# Patient Record
Sex: Male | Born: 1965 | Race: Black or African American | Hispanic: No | Marital: Married | Smoking: Current every day smoker
Health system: Southern US, Community
[De-identification: ages and names within clinical notes are randomized; demographics above are authoritative.]

## PROBLEM LIST (undated history)

## (undated) ENCOUNTER — Emergency Department (HOSPITAL_COMMUNITY): Discharge status: Absent without leave | Payer: BC Managed Care – PPO

## (undated) DIAGNOSIS — Z87442 Personal history of urinary calculi: Secondary | ICD-10-CM

## (undated) HISTORY — PX: HERNIA REPAIR: SHX51

---

## 2013-11-22 ENCOUNTER — Ambulatory Visit: Payer: Self-pay | Admitting: Internal Medicine

## 2013-12-04 ENCOUNTER — Ambulatory Visit: Payer: Self-pay | Admitting: Internal Medicine

## 2018-03-20 ENCOUNTER — Emergency Department (HOSPITAL_COMMUNITY)
Admission: EM | Admit: 2018-03-20 | Discharge: 2018-03-20 | Disposition: A | Discharge status: Home / Self Care | Payer: BLUE CROSS/BLUE SHIELD | Attending: Emergency Medicine | Admitting: Emergency Medicine

## 2018-03-20 ENCOUNTER — Encounter (HOSPITAL_COMMUNITY): Payer: Self-pay | Admitting: Emergency Medicine

## 2018-03-20 DIAGNOSIS — K649 Unspecified hemorrhoids: Secondary | ICD-10-CM | POA: Diagnosis present

## 2018-03-20 DIAGNOSIS — K623 Rectal prolapse: Secondary | ICD-10-CM | POA: Diagnosis not present

## 2018-03-20 DIAGNOSIS — F17210 Nicotine dependence, cigarettes, uncomplicated: Secondary | ICD-10-CM | POA: Diagnosis not present

## 2018-03-20 NOTE — ED Provider Notes (Signed)
[facility] EMERGENCY DEPARTMENT Provider Note   CSN: [id number] Arrival date & time: 03/20/18  0426     History   Chief Complaint Chief Complaint  Patient presents with  . Hemorrhoids    HPI Traylon Schimming is a 52 y.o. male.  The history is provided by the patient and the spouse. No language interpreter was used.     52 year old male with history of alcohol abuse presenting complaining of rectal pain.  History obtained through wife who is at bedside.  Patient has had a "knot" in his rectum which has been presents for "quite a while" however lately it has increased in size and became more painful.  Patient also admits to consume alcohol on a regular basis last consumption was last night.  Most of the history was obtained through wife who is at bedside.  Patient denies any rectal bleeding.  Denies any abdominal pain or back pain.  History reviewed. No pertinent past medical history.  There are no active problems to display for this patient.   Past Surgical History:  Procedure Laterality Date  . HERNIA REPAIR          Home Medications    Prior to Admission medications   Not on File    Family History No family history on file.  Social History Social History   Tobacco Use  . Smoking status: Current Every Day Smoker    Packs/day: 1.00  Substance Use Topics  . Alcohol use: Yes    Comment: daily 5-40 oz beers  . Drug use: Not on file     Allergies   Patient has no known allergies.   Review of Systems Review of Systems  Constitutional: Negative for fever.  Gastrointestinal: Positive for rectal pain. Negative for abdominal pain.     Physical Exam Updated Vital Signs BP (!) 166/97   Pulse 89   Temp 97.6 F (36.4 C) (Oral)   Resp 15   SpO2 94%   Physical Exam  Constitutional: He appears well-developed and well-nourished. No distress.  Patient resting comfortably in bed, appeared to be intoxicated  HENT:  Head: Atraumatic.  Eyes:  Conjunctivae are normal.  Neck: Neck supple.  Abdominal: Soft. He exhibits no distension. There is no tenderness.  Genitourinary:  Genitourinary Comments: Chaperone present during exam.  No external hemorrhoid noted.  Mild rectal prolapse easily reducible with direct pressure.  No obvious mass, no anal fissure, normal color stool on glove  Neurological: He is alert.  Skin: No rash noted.  Psychiatric: He has a normal mood and affect.  Nursing note and vitals reviewed.    ED Treatments / Results  Labs (all labs ordered are listed, but only abnormal results are displayed) Labs Reviewed - No data to display  EKG None  Radiology No results found.  Procedures Procedures (including critical care time)  Medications Ordered in ED Medications - No data to display   Initial Impression / Assessment and Plan / ED Course  I have reviewed the triage vital signs and the nursing notes.  Pertinent labs & imaging results that were available during my care of the patient were reviewed by me and considered in my medical decision making (see chart for details).     BP (!) 166/97   Pulse 89   Temp 97.6 F (36.4 C) (Oral)   Resp 15   SpO2 94%    Final Clinical Impressions(s) / ED Diagnoses   Final diagnoses:  Rectal prolapse    ED  Discharge Orders    None     6:49 AM Patient has evidence of a rectal prolapse easily reducible on exam.  No evidence of thrombosed hemorrhoid.  Encourage patient to follow-up with general surgery for further management as necessary.  Patient does have history of alcohol abuse and is intoxicated.  His wife is at bedside.  I offer alcohol detox and support but patient declined.  Will provide resources at discharge.  Return precautions discussed.   Fayrene Helper, PA-C 03/20/18 1610    Nira Conn, MD 03/21/18 0010

## 2018-03-20 NOTE — ED Triage Notes (Addendum)
Patient reports painful, bleeding knot to his rectum, states it has been there for a while but recently got painful. Pt intoxicated in triage, unable to provide much history, unsteady gait-pts significant other states patient drinks daily and has been drinking tonight.

## 2018-04-11 ENCOUNTER — Ambulatory Visit: Payer: Self-pay | Admitting: Surgery

## 2019-11-12 ENCOUNTER — Other Ambulatory Visit: Payer: Self-pay

## 2019-11-12 ENCOUNTER — Encounter (HOSPITAL_COMMUNITY): Payer: Self-pay | Admitting: Emergency Medicine

## 2019-11-12 ENCOUNTER — Emergency Department (HOSPITAL_COMMUNITY)
Admission: EM | Admit: 2019-11-12 | Discharge: 2019-11-12 | Disposition: A | Discharge status: Home / Self Care | Payer: BC Managed Care – PPO | Attending: Emergency Medicine | Admitting: Emergency Medicine

## 2019-11-12 DIAGNOSIS — F17210 Nicotine dependence, cigarettes, uncomplicated: Secondary | ICD-10-CM | POA: Diagnosis not present

## 2019-11-12 DIAGNOSIS — L309 Dermatitis, unspecified: Secondary | ICD-10-CM

## 2019-11-12 DIAGNOSIS — R21 Rash and other nonspecific skin eruption: Secondary | ICD-10-CM | POA: Diagnosis present

## 2019-11-12 MED ORDER — DIPHENHYDRAMINE HCL 25 MG PO TABS: 25.0000 mg | ORAL_TABLET | Freq: Four times a day (QID) | ORAL | 0 refills | Status: DC | PRN

## 2019-11-12 MED ORDER — HYDROCORTISONE 2.5 % EX LOTN: TOPICAL_LOTION | Freq: Two times a day (BID) | CUTANEOUS | 0 refills | Status: DC

## 2019-11-12 MED ORDER — PREDNISONE 10 MG PO TABS: 20.0000 mg | ORAL_TABLET | Freq: Every day | ORAL | 0 refills | Status: DC

## 2019-11-12 NOTE — ED Notes (Signed)
Pt verbalize d/c paperwork understanding.

## 2019-11-12 NOTE — ED Provider Notes (Signed)
[facility] EMERGENCY DEPARTMENT Provider Note   CSN: [id number] Arrival date & time: 11/12/19  3419     History Chief Complaint  Patient presents with  . Rash    Basil Buffin is a 54 y.o. male.  HPI 54 yo male presents complaining of rash for 6- 8 months.  Patient has noted red areas that became grayish.  Rash began on face and is now most prominent in bilateral axillas, behind his ears, and an area on the scalp.  The areas itch but are not painful.  No fever, cough, abdominal pain, or new product or allergen exposure known.  No intervention tried.  Symptoms have continued to spread. No known hepatitis.    There are no problems to display for this patient.   Past Surgical History:  Procedure Laterality Date  . HERNIA REPAIR         No family history on file.  Social History   Tobacco Use  . Smoking status: Current Every Day Smoker    Packs/day: 1.00  . Smokeless tobacco: Never Used  Substance Use Topics  . Alcohol use: Yes    Comment: daily 5-40 oz beers  . Drug use: Not Currently    Home Medications Prior to Admission medications   Not on File    Allergies    Patient has no known allergies.  Review of Systems   Review of Systems  All other systems reviewed and are negative.   Physical Exam Updated Vital Signs BP (!) 144/93 (BP Location: Left Arm)   Pulse 86   Temp 97.8 F (36.6 C) (Oral)   Resp 20   SpO2 96%   Physical Exam Vitals and nursing note reviewed.  Constitutional:      General: He is not in acute distress.    Appearance: Normal appearance. He is normal weight. He is not ill-appearing.  HENT:     Head: Normocephalic.     Ears:     Comments: External ear with plaques behind ears and some external ear involvement    Nose: Nose normal.     Mouth/Throat:     Mouth: Mucous membranes are moist.     Pharynx: Oropharynx is clear.  Eyes:     Extraocular Movements: Extraocular movements intact.     Pupils: Pupils are  equal, round, and reactive to light.  Cardiovascular:     Rate and Rhythm: Normal rate and regular rhythm.     Pulses: Normal pulses.  Pulmonary:     Effort: Pulmonary effort is normal.     Breath sounds: Normal breath sounds.  Abdominal:     General: Abdomen is flat.     Palpations: Abdomen is soft.  Musculoskeletal:        General: Normal range of motion.     Cervical back: Normal range of motion.  Skin:    General: Skin is warm and dry.     Capillary Refill: Capillary refill takes less than 2 seconds.     Findings: Rash present.     Comments: 3 x3 cm plaque right scalp grayish and patterned Axillary rash similar but more erythematous  Neurological:     General: No focal deficit present.     Mental Status: He is alert.  Psychiatric:        Mood and Affect: Mood normal.     ED Results / Procedures / Treatments   Labs (all labs ordered are listed, but only abnormal results are displayed) Labs Reviewed - No  data to display  EKG None  Radiology No results found.  Procedures Procedures (including critical care time)  Medications Ordered in ED Medications - No data to display  ED Course  I have reviewed the triage vital signs and the nursing notes.  Pertinent labs & imaging results that were available during my care of the patient were reviewed by me and considered in my medical decision making (see chart for details).    MDM Rules/Calculators/A&P                           Patient with dermatitis.  Unclear definitive diagnosis or etiology.  Patient may well need work up if current interventions do not improve.  Patient advised to obtain primary care and dermatology follow up. Plan oral steroid, benadryl, topical steroid.   Final Clinical Impression(s) / ED Diagnoses Final diagnoses:  Dermatitis    Rx / DC Orders ED Discharge Orders    None       Pattricia Boss, MD 11/12/19 [phone]

## 2019-11-12 NOTE — ED Triage Notes (Signed)
C/o itchy patches of skin on head and face x 2 months.

## 2019-11-12 NOTE — Discharge Instructions (Addendum)
Please take medication as prescribed. Make appointment with primary care doctor for complete physical and check up if symptoms do not resolve.

## 2020-02-20 ENCOUNTER — Emergency Department (HOSPITAL_COMMUNITY): Payer: BC Managed Care – PPO

## 2020-02-20 ENCOUNTER — Emergency Department (HOSPITAL_COMMUNITY)
Admission: EM | Admit: 2020-02-20 | Discharge: 2020-02-20 | Disposition: A | Discharge status: Home / Self Care | Payer: BC Managed Care – PPO | Attending: Emergency Medicine | Admitting: Emergency Medicine

## 2020-02-20 ENCOUNTER — Encounter (HOSPITAL_COMMUNITY): Payer: Self-pay | Admitting: Emergency Medicine

## 2020-02-20 DIAGNOSIS — N202 Calculus of kidney with calculus of ureter: Secondary | ICD-10-CM | POA: Insufficient documentation

## 2020-02-20 DIAGNOSIS — R030 Elevated blood-pressure reading, without diagnosis of hypertension: Secondary | ICD-10-CM

## 2020-02-20 DIAGNOSIS — N23 Unspecified renal colic: Secondary | ICD-10-CM

## 2020-02-20 DIAGNOSIS — F17200 Nicotine dependence, unspecified, uncomplicated: Secondary | ICD-10-CM | POA: Insufficient documentation

## 2020-02-20 DIAGNOSIS — R1031 Right lower quadrant pain: Secondary | ICD-10-CM | POA: Diagnosis present

## 2020-02-20 DIAGNOSIS — N200 Calculus of kidney: Secondary | ICD-10-CM

## 2020-02-20 LAB — CBC
HCT: 48.4 % (ref 39.0–52.0)
Hemoglobin: 16.6 g/dL (ref 13.0–17.0)
MCH: 31.1 pg (ref 26.0–34.0)
MCHC: 34.3 g/dL (ref 30.0–36.0)
MCV: 90.6 fL (ref 80.0–100.0)
Platelets: 300 10*3/uL (ref 150–400)
RBC: 5.34 MIL/uL (ref 4.22–5.81)
RDW: 12.1 % (ref 11.5–15.5)
WBC: 13.2 10*3/uL — ABNORMAL HIGH (ref 4.0–10.5)
nRBC: 0 % (ref 0.0–0.2)

## 2020-02-20 LAB — URINALYSIS, ROUTINE W REFLEX MICROSCOPIC
Bilirubin Urine: NEGATIVE
Glucose, UA: 50 mg/dL — AB
Ketones, ur: 80 mg/dL — AB
Leukocytes,Ua: NEGATIVE
Nitrite: NEGATIVE
Protein, ur: 100 mg/dL — AB
Specific Gravity, Urine: 1.017 (ref 1.005–1.030)
pH: 5 (ref 5.0–8.0)

## 2020-02-20 LAB — COMPREHENSIVE METABOLIC PANEL
ALT: 32 U/L (ref 0–44)
AST: 41 U/L (ref 15–41)
Albumin: 4.7 g/dL (ref 3.5–5.0)
Alkaline Phosphatase: 51 U/L (ref 38–126)
Anion gap: 16 — ABNORMAL HIGH (ref 5–15)
BUN: 16 mg/dL (ref 6–20)
CO2: 20 mmol/L — ABNORMAL LOW (ref 22–32)
Calcium: 9.7 mg/dL (ref 8.9–10.3)
Chloride: 102 mmol/L (ref 98–111)
Creatinine, Ser: 1.31 mg/dL — ABNORMAL HIGH (ref 0.61–1.24)
GFR calc Af Amer: >60 mL/min (ref >60)
GFR calc non Af Amer: >60 mL/min (ref >60)
Glucose, Bld: 137 mg/dL — ABNORMAL HIGH (ref 70–99)
Potassium: 4.3 mmol/L (ref 3.5–5.1)
Sodium: 138 mmol/L (ref 135–145)
Total Bilirubin: 1.1 mg/dL (ref 0.3–1.2)
Total Protein: 8.6 g/dL — ABNORMAL HIGH (ref 6.5–8.1)

## 2020-02-20 LAB — LIPASE, BLOOD: Lipase: 25 U/L (ref 11–51)

## 2020-02-20 MED ORDER — HYDROCODONE-ACETAMINOPHEN 5-325 MG PO TABS: 1.0000 | ORAL_TABLET | Freq: Four times a day (QID) | ORAL | 0 refills | Status: DC | PRN

## 2020-02-20 MED ORDER — LACTATED RINGERS IV BOLUS
1000.0000 mL | Freq: Once | INTRAVENOUS | Status: AC
  Administered 2020-02-20: 1000 mL via INTRAVENOUS

## 2020-02-20 MED ORDER — KETOROLAC TROMETHAMINE 30 MG/ML IJ SOLN
30.0000 mg | Freq: Once | INTRAMUSCULAR | Status: AC
  Administered 2020-02-20: 30 mg via INTRAVENOUS
  Filled 2020-02-20: qty 1

## 2020-02-20 MED ORDER — ONDANSETRON HCL 4 MG/2ML IJ SOLN
4.0000 mg | Freq: Once | INTRAMUSCULAR | Status: AC
  Administered 2020-02-20: 4 mg via INTRAVENOUS
  Filled 2020-02-20: qty 2

## 2020-02-20 MED ORDER — FENTANYL CITRATE (PF) 100 MCG/2ML IJ SOLN
100.0000 ug | Freq: Once | INTRAMUSCULAR | Status: AC
  Administered 2020-02-20: 100 ug via INTRAVENOUS
  Filled 2020-02-20: qty 2

## 2020-02-20 MED ORDER — ONDANSETRON 8 MG PO TBDP: 8.0000 mg | ORAL_TABLET | Freq: Three times a day (TID) | ORAL | 0 refills | Status: AC | PRN

## 2020-02-20 NOTE — ED Triage Notes (Signed)
Per EMS, patient from home, c/o RLQ pain since 0600 today. Describes pain as constant cramp. Reports vomiting and diarrhea.

## 2020-02-20 NOTE — ED Notes (Signed)
   02/20/20 1700  Vitals  BP (!) 209/104  MAP (mmHg) 133  BP Location Right Arm  BP Method Automatic  Patient Position (if appropriate) Lying  Pulse Rate 72  ECG Heart Rate 72  Resp (!) 30  MEWS COLOR  MEWS Score Color Red  Oxygen Therapy  SpO2 94 %  O2 Device Room Air  MEWS Score  MEWS Temp 0  MEWS Systolic 2  MEWS Pulse 0  MEWS RR 2  MEWS LOC 0  MEWS Score 4  W[place], EDP made aware of patient current BP. No new orders.

## 2020-02-20 NOTE — ED Provider Notes (Signed)
[facility]-EMERGENCY DEPT Provider Note   CSN: [id number] Arrival date & time: 02/20/20  1534     History Chief Complaint  Patient presents with  . Abdominal Pain    Roberto Rogers is a 54 y.o. male.  The history is provided by the patient and the spouse.  Abdominal Pain Pain location:  RLQ Pain quality: cramping   Pain radiates to:  Does not radiate Pain severity:  Severe Onset quality:  Gradual Duration:  10 hours Timing:  Constant Progression:  Worsening Chronicity:  New Relieved by:  Not moving Worsened by:  Movement and palpation Associated symptoms: vomiting   Associated symptoms: no chest pain, no constipation, no diarrhea, no dysuria, no fever, no hematemesis, no hematochezia, no nausea and no shortness of breath   patient reports waking up with RLQ pain He reports it is worsening No fever but he does have vomiting No change in bowel movements   PMH-none Past Surgical History:  Procedure Laterality Date  . HERNIA REPAIR         No family history on file.  Social History   Tobacco Use  . Smoking status: Current Every Day Smoker    Packs/day: 1.00  . Smokeless tobacco: Never Used  Substance Use Topics  . Alcohol use: Yes    Comment: daily 5-40 oz beers  . Drug use: Not Currently    Home Medications Prior to Admission medications   Medication Sig Start Date End Date Taking? Authorizing Provider  diphenhydrAMINE (BENADRYL) 25 MG tablet Take 1 tablet (25 mg total) by mouth every 6 (six) hours as needed. 11/12/19   Margarita Grizzle, MD  hydrocortisone 2.5 % lotion Apply topically 2 (two) times daily. 11/12/19   Margarita Grizzle, MD  predniSONE (DELTASONE) 10 MG tablet Take 2 tablets (20 mg total) by mouth daily. 11/12/19   Margarita Grizzle, MD    Allergies    Patient has no known allergies.  Review of Systems   Review of Systems  Constitutional: Negative for fever.  Respiratory: Negative for shortness of breath.   Cardiovascular:  Negative for chest pain.  Gastrointestinal: Positive for abdominal pain and vomiting. Negative for blood in stool, constipation, diarrhea, hematemesis, hematochezia and nausea.  Genitourinary: Negative for dysuria, scrotal swelling and testicular pain.  All other systems reviewed and are negative.   Physical Exam Updated Vital Signs BP (!) 211/112   Pulse 67   Temp 97.8 F (36.6 C) (Oral)   Resp (!) 29   Ht 1.676 m (5\' 6" )   Wt 72.6 kg   SpO2 98%   BMI 25.82 kg/m   Physical Exam CONSTITUTIONAL: Well developed/well nourished HEAD: Normocephalic/atraumatic EYES: EOMI/PERRL ENMT: Mucous membranes moist NECK: supple no meningeal signs SPINE/BACK:entire spine nontender CV: S1/S2 noted, no murmurs/rubs/gallops noted LUNGS: Lungs are clear to auscultation bilaterally, no apparent distress ABDOMEN: soft, moderate RLQ tenderness, no rebound or guarding, bowel sounds noted throughout abdomen GU:no cva tenderness NEURO: Pt is awake/alert/appropriate, moves all extremitiesx4.  No facial droop.   EXTREMITIES: pulses normal/equal, full ROM SKIN: warm, color normal PSYCH: no abnormalities of mood noted, alert and oriented to situation  ED Results / Procedures / Treatments   Labs (all labs ordered are listed, but only abnormal results are displayed) Labs Reviewed  COMPREHENSIVE METABOLIC PANEL - Abnormal; Notable for the following components:      Result Value   CO2 20 (*)    Glucose, Bld 137 (*)    Creatinine, Ser 1.31 (*)    Total  Protein 8.6 (*)    Anion gap 16 (*)    All other components within normal limits  CBC - Abnormal; Notable for the following components:   WBC 13.2 (*)    All other components within normal limits  URINALYSIS, ROUTINE W REFLEX MICROSCOPIC - Abnormal; Notable for the following components:   Color, Urine STRAW (*)    Glucose, UA 50 (*)    Hgb urine dipstick LARGE (*)    Ketones, ur 80 (*)    Protein, ur 100 (*)    Bacteria, UA RARE (*)    All other  components within normal limits  LIPASE, BLOOD    EKG None  Radiology CT Renal Stone Study  Result Date: 02/20/2020 CLINICAL DATA:  Right lower quadrant pain EXAM: CT ABDOMEN AND PELVIS WITHOUT CONTRAST TECHNIQUE: Multidetector CT imaging of the abdomen and pelvis was performed following the standard protocol without IV contrast. COMPARISON:  None. FINDINGS: Lower chest: The lung bases are clear. The heart size is normal. Hepatobiliary: There is decreased hepatic attenuation suggestive of hepatic steatosis. Normal gallbladder.There is no biliary ductal dilation. Pancreas: Normal contours without ductal dilatation. No peripancreatic fluid collection. Spleen: Unremarkable. Adrenals/Urinary Tract: --Adrenal glands: Unremarkable. --Right kidney/ureter: There is mild-to-moderate right-sided hydronephrosis secondary to an obstructing 4 mm stone in the mid to proximal right ureter (axial series 2, image 43). There is a nonobstructing stone in the lower pole the right kidney measuring approximately 5 mm. --Left kidney/ureter: No hydronephrosis or radiopaque kidney stones. --Urinary bladder: Unremarkable. Stomach/Bowel: --Stomach/Duodenum: No hiatal hernia or other gastric abnormality. Normal duodenal course and caliber. --Small bowel: Unremarkable. --Colon: There are scattered colonic diverticula without CT evidence for diverticulitis. --Appendix: Normal. Vascular/Lymphatic: Normal course and caliber of the major abdominal vessels. --No retroperitoneal lymphadenopathy. --No mesenteric lymphadenopathy. --No pelvic or inguinal lymphadenopathy. Reproductive: Unremarkable Other: No ascites or free air. The abdominal wall is normal. Musculoskeletal. No acute displaced fractures. IMPRESSION: 1. Right-sided obstructive uropathy secondary to an obstructing 4 mm stone in the mid to proximal right ureter. 2. Nonobstructive right nephrolithiasis. 3. Hepatic steatosis. 4. Scattered colonic diverticula without CT evidence for  diverticulitis. Electronically Signed   By: Katherine Mantle M.D.   On: 02/20/2020 17:28    Procedures Procedures    Medications Ordered in ED Medications  lactated ringers bolus 1,000 mL (has no administration in time range)  fentaNYL (SUBLIMAZE) injection 100 mcg (100 mcg Intravenous Given 02/20/20 1656)  ondansetron (ZOFRAN) injection 4 mg (4 mg Intravenous Given 02/20/20 1656)  ketorolac (TORADOL) 30 MG/ML injection 30 mg (30 mg Intravenous Given 02/20/20 1742)    ED Course  I have reviewed the triage vital signs and the nursing notes.  Pertinent labs & imaging results that were available during my care of the patient were reviewed by me and considered in my medical decision making (see chart for details).    MDM Rules/Calculators/A&P                           This patient presents to the ED for concern of abdominal pain, this involves an extensive number of treatment options, and is a complaint that carries with it a high risk of complications and morbidity.  The differential diagnosis includes appendicitis, cholecystitis, diverticulitis, kidney stone, uti   Lab Tests:   I Ordered, reviewed, and interpreted labs, which included urinalysis, complete blood count, metabolic panel, LFTs, lipase  Medicines ordered:   I ordered medication fentanyl and Zofran for pain  and nausea  Imaging Studies ordered:   I ordered imaging studies which included CT imaging   I independently visualized and interpreted imaging which showed right ureteral stone  Additional history obtained:   Additional history obtained from wife via phone  Previous records obtained and reviewed    Reevaluation:  After the interventions stated above, I reevaluated the patient and found patient is improving 5:52 PM Patient found to have right ureteral stone.  Appendix is negative Patient is improving.  Will refer to urology.  He will also have follow-up with PCP as he likely has untreated  hypertension 6:30 PM Discussed with wife and patient about his findings, need for urology follow-up, as well as need for PCP evaluation as he likely has untreated hypertension.  Patient wife agree. Discussed return precautions.  Final Clinical Impression(s) / ED Diagnoses Final diagnoses:  Ureteral colic  Kidney stone on right side  Elevated blood pressure reading    Rx / DC Orders ED Discharge Orders         Ordered    HYDROcodone-acetaminophen (NORCO/VICODIN) 5-325 MG tablet  Every 6 hours PRN        02/20/20 1830    ondansetron (ZOFRAN ODT) 8 MG disintegrating tablet  Every 8 hours PRN        02/20/20 1831           Zadie Rhine, MD 02/20/20 1831

## 2020-05-31 ENCOUNTER — Encounter (HOSPITAL_COMMUNITY): Payer: Self-pay | Admitting: Emergency Medicine

## 2020-05-31 ENCOUNTER — Emergency Department (HOSPITAL_COMMUNITY)
Admission: EM | Admit: 2020-05-31 | Discharge: 2020-05-31 | Disposition: A | Discharge status: Home / Self Care | Payer: BC Managed Care – PPO | Source: Home / Self Care | Attending: Emergency Medicine | Admitting: Emergency Medicine

## 2020-05-31 ENCOUNTER — Emergency Department (HOSPITAL_COMMUNITY): Payer: BC Managed Care – PPO

## 2020-05-31 ENCOUNTER — Other Ambulatory Visit: Payer: Self-pay

## 2020-05-31 DIAGNOSIS — S52562A Barton's fracture of left radius, initial encounter for closed fracture: Secondary | ICD-10-CM | POA: Insufficient documentation

## 2020-05-31 DIAGNOSIS — W108XXA Fall (on) (from) other stairs and steps, initial encounter: Secondary | ICD-10-CM | POA: Insufficient documentation

## 2020-05-31 DIAGNOSIS — W109XXA Fall (on) (from) unspecified stairs and steps, initial encounter: Secondary | ICD-10-CM | POA: Diagnosis not present

## 2020-05-31 DIAGNOSIS — F17200 Nicotine dependence, unspecified, uncomplicated: Secondary | ICD-10-CM | POA: Insufficient documentation

## 2020-05-31 DIAGNOSIS — Z20822 Contact with and (suspected) exposure to covid-19: Secondary | ICD-10-CM | POA: Diagnosis not present

## 2020-05-31 DIAGNOSIS — S52572A Other intraarticular fracture of lower end of left radius, initial encounter for closed fracture: Secondary | ICD-10-CM | POA: Diagnosis present

## 2020-05-31 MED ORDER — HYDROCODONE-ACETAMINOPHEN 5-325 MG PO TABS: 1.0000 | ORAL_TABLET | Freq: Four times a day (QID) | ORAL | 0 refills | Status: DC | PRN

## 2020-05-31 NOTE — Discharge Instructions (Signed)
You were evaluated in the emergency department today for your left wrist pain and deformity after a fall this morning.  You were found to have a fracture of your radius, one of the bones in your forearm. Additionally, there was a small fracture of your other forearm bone called your ulna.   I have spoken with the hand surgeon, who is recommending that you be scheduled for surgery tomorrow. You should expect a call from him this afternoon for further discussion of your surgical plan.   You have been placed in a splint, which you should leave in place until you see the hand surgeon. You have been prescribed a narcotic pain medication called Norco, which you have taken in the past, and you may take now as needed for your wrist pain.   Return to the ED if you develop any numbness, tingling, cold-to-touch, in your hand, or any other new severe symptoms.

## 2020-05-31 NOTE — Progress Notes (Signed)
Orthopedic Tech Progress Note Patient Details:  Marc Leichter 02-26-1966 [id number]  Ortho Devices Type of Ortho Device: Ace wrap,Sugartong splint Ortho Device/Splint Location: applied splint and sling to LUE Ortho Device/Splint Interventions: Ordered,Application   Post Interventions Patient Tolerated: Well Instructions Provided: Care of device   Jennye Moccasin 05/31/2020, 3:16 PM

## 2020-05-31 NOTE — ED Triage Notes (Signed)
Per pt, states he fell landing on left wrist-swelling and pain

## 2020-05-31 NOTE — ED Provider Notes (Signed)
[facility]-EMERGENCY DEPT Provider Note   CSN: [id number] Arrival date & time: 05/31/20  1037     History Chief Complaint  Patient presents with  . Wrist Pain    Roberto Rogers is a 54 y.o. male presents with concern for left wrist pain and deformity after tripping going down the steps today. Patient states that his shoe got caught, fell forward onto the concrete, catching himself with his outstretched left arm. Immediately had pain and deformity of the wrist.  He denies numbness, tingling, weakness in his hand since then. Denies pain in his elbow or his shoulder. He has taken tylenol at home for his pain.   He denies sensation of dizziness or lightheadedness prior to his fall. Denies head trauma, LOC, nausea or vomiting since that time.  I personally reviewed the patient's medical records. He has history of hernia repair, ureterolithiasis, current everyday smoker. Otherwise he does not carry medical diagnoses and is not on any medications every day.  HPI     History reviewed. No pertinent past medical history.  There are no problems to display for this patient.   Past Surgical History:  Procedure Laterality Date  . HERNIA REPAIR         No family history on file.  Social History   Tobacco Use  . Smoking status: Current Every Day Smoker    Packs/day: 1.00  . Smokeless tobacco: Never Used  Substance Use Topics  . Alcohol use: Yes    Comment: daily 5-40 oz beers  . Drug use: Not Currently    Home Medications Prior to Admission medications   Medication Sig Start Date End Date Taking? Authorizing Provider  HYDROcodone-acetaminophen (NORCO) 5-325 MG tablet Take 1 tablet by mouth every 6 (six) hours as needed for moderate pain. 05/31/20  Yes Rollie Hynek, Eugene Gavia, PA-C  acetaminophen (TYLENOL) 500 MG tablet Take 1,000 mg by mouth every 6 (six) hours as needed for moderate pain.    [provider]  ondansetron (ZOFRAN ODT) 8 MG  disintegrating tablet Take 1 tablet (8 mg total) by mouth every 8 (eight) hours as needed for nausea or vomiting. 02/20/20   Zadie Rhine, MD  diphenhydrAMINE (BENADRYL) 25 MG tablet Take 1 tablet (25 mg total) by mouth every 6 (six) hours as needed. 11/12/19 02/20/20  Margarita Grizzle, MD    Allergies    Patient has no known allergies.  Review of Systems   Review of Systems  Constitutional: Negative.   HENT: Negative.   Respiratory: Negative.   Cardiovascular: Negative.   Gastrointestinal: Negative.   Musculoskeletal: Positive for joint swelling.       Left wrist pain and deformity  Neurological: Negative for dizziness, syncope, weakness, light-headedness and headaches.    Physical Exam Updated Vital Signs BP (!) 184/123   Pulse 78   Temp 98.5 F (36.9 C) (Oral)   Resp 16   SpO2 96%   Physical Exam Vitals and nursing note reviewed.  HENT:     Head: Normocephalic and atraumatic.     Nose: Nose normal.     Mouth/Throat:     Mouth: Mucous membranes are moist.     Pharynx: No oropharyngeal exudate or posterior oropharyngeal erythema.  Eyes:     General:        Right eye: No discharge.        Left eye: No discharge.     Conjunctiva/sclera: Conjunctivae normal.     Pupils: Pupils are equal, round, and reactive to light.  Cardiovascular:     Rate and Rhythm: Normal rate and regular rhythm.     Pulses:          Radial pulses are 2+ on the right side and 2+ on the left side.     Heart sounds: Normal heart sounds. No murmur heard.   Pulmonary:     Effort: Pulmonary effort is normal. No respiratory distress.     Breath sounds: Normal breath sounds. No wheezing or rales.  Abdominal:     General: Bowel sounds are normal. There is no distension.     Palpations: Abdomen is soft.     Tenderness: There is no abdominal tenderness. There is no right CVA tenderness or left CVA tenderness.  Musculoskeletal:     Right shoulder: Normal.     Left shoulder: Normal.     Right upper  arm: Normal.     Left upper arm: Normal.     Right elbow: Normal.     Left elbow: Normal.     Right forearm: Normal.     Left forearm: Normal.     Right wrist: Normal.     Left wrist: Swelling, deformity, tenderness and bony tenderness present. No snuff box tenderness or crepitus.     Right hand: Normal.     Left hand: Swelling present. No tenderness or bony tenderness. Normal range of motion. Normal capillary refill.       Arms:     Cervical back: Neck supple. No tenderness.     Right lower leg: No edema.     Left lower leg: No edema.     Comments: FROM of the digits of the left hand in flexion and extension. FROM of the left elbow   Lymphadenopathy:     Cervical: No cervical adenopathy.  Skin:    General: Skin is warm and dry.     Capillary Refill: Capillary refill takes less than 2 seconds.  Neurological:     General: No focal deficit present.     Mental Status: He is alert and oriented to person, place, and time. Mental status is at baseline.  Psychiatric:        Mood and Affect: Mood normal.     ED Results / Procedures / Treatments   Labs (all labs ordered are listed, but only abnormal results are displayed) Labs Reviewed - No data to display  EKG None  Radiology DG Wrist Complete Left  Result Date: 05/31/2020 CLINICAL DATA:  Tripped and fell today landing on the outstretched left hand. Left wrist pain. EXAM: LEFT WRIST - COMPLETE 3+ VIEW COMPARISON:  None. FINDINGS: Comminuted, intra-articular fracture of the distal radius. There are transverse and oblique fracture lines. Fractures intersect the articular surface between the lunate and scaphoid facets of the distal radius. Full low fracture components are displaced anteriorly by approximately 6 mm, carpus displacing with the volar fragment component. Small associated ulnar styloid fracture. Joints are normally spaced and aligned. There is surrounding soft tissue swelling. IMPRESSION: 1. Barton's fracture of the distal  radius, with intra-articular extension and with mild volar displacement of approximately 6 mm. Small associated ulnar styloid fracture. No dislocation. Electronically Signed   By: Amie Portland M.D.   On: 05/31/2020 11:16    Procedures Procedures (including critical care time)  Medications Ordered in ED Medications - No data to display  ED Course  I have reviewed the triage vital signs and the nursing notes.  Pertinent labs & imaging results that were available during my care  of the patient were reviewed by me and considered in my medical decision making (see chart for details).    MDM Rules/Calculators/A&P                         54 year old male who presents after FOOSH after tripping on the stairs. Pain and deformity to his left wrist. Patient not on any anticoagulation.   Hypertensive on intake, x-ray in triage revealed Barton's fracture of the distal left radius with intra-articular extension and mild volar displacement ~ 6 mm. A small associated ulnar styloid fracture without dislocation.  On physical exam there is obvious deformity of the left wrist, with significant edema. Radial pulse 2+, normal cap refill in all 5 digits of the left hand, neurovascularly intact in the left hand.  Consult placed to hand surgery, Dr. Melvyn Novas who recommends placement in splint for comfort with plan to perform surgery tomorrow. Dr. Melvyn Novas to call patient this afternoon to discuss surgical plan. Will place in splint and discharge with short course prescription for narcotic pain medication.  Jairus voiced understanding of his medical evaluation and treatment plan.  Each of his questions were answered to his expressed satisfaction.  Return precautions were given.  Patient is stable and appropriate for discharge at this time.  Final Clinical Impression(s) / ED Diagnoses Final diagnoses:  Closed Barton's fracture of left radius, initial encounter    Rx / DC Orders ED Discharge Orders         Ordered     HYDROcodone-acetaminophen (NORCO) 5-325 MG tablet  Every 6 hours PRN        05/31/20 1516           Oumar Marcott, Eugene Gavia, PA-C 05/31/20 1547    Pollyann Savoy, MD 05/31/20 [phone]

## 2020-06-01 ENCOUNTER — Inpatient Hospital Stay (HOSPITAL_COMMUNITY): Payer: BC Managed Care – PPO

## 2020-06-01 ENCOUNTER — Inpatient Hospital Stay (HOSPITAL_COMMUNITY): Payer: BC Managed Care – PPO | Admitting: Certified Registered Nurse Anesthetist

## 2020-06-01 ENCOUNTER — Telehealth (HOSPITAL_COMMUNITY): Payer: Self-pay | Admitting: Emergency Medicine

## 2020-06-01 ENCOUNTER — Ambulatory Visit (HOSPITAL_COMMUNITY)
Admission: RE | Admit: 2020-06-01 | Discharge: 2020-06-01 | Disposition: A | Discharge status: Home / Self Care | Payer: BC Managed Care – PPO | Attending: Orthopedic Surgery | Admitting: Orthopedic Surgery

## 2020-06-01 ENCOUNTER — Encounter (HOSPITAL_COMMUNITY): Payer: Self-pay | Admitting: Orthopedic Surgery

## 2020-06-01 ENCOUNTER — Other Ambulatory Visit: Payer: Self-pay

## 2020-06-01 ENCOUNTER — Encounter (HOSPITAL_COMMUNITY)
Admission: RE | Disposition: A | Discharge status: Home / Self Care | Payer: Self-pay | Source: Home / Self Care | Attending: Orthopedic Surgery

## 2020-06-01 DIAGNOSIS — S52572A Other intraarticular fracture of lower end of left radius, initial encounter for closed fracture: Secondary | ICD-10-CM | POA: Insufficient documentation

## 2020-06-01 DIAGNOSIS — W109XXA Fall (on) (from) unspecified stairs and steps, initial encounter: Secondary | ICD-10-CM | POA: Insufficient documentation

## 2020-06-01 DIAGNOSIS — F17200 Nicotine dependence, unspecified, uncomplicated: Secondary | ICD-10-CM | POA: Insufficient documentation

## 2020-06-01 DIAGNOSIS — S52502A Unspecified fracture of the lower end of left radius, initial encounter for closed fracture: Secondary | ICD-10-CM

## 2020-06-01 DIAGNOSIS — Z20822 Contact with and (suspected) exposure to covid-19: Secondary | ICD-10-CM | POA: Insufficient documentation

## 2020-06-01 HISTORY — DX: Personal history of urinary calculi: Z87.442

## 2020-06-01 HISTORY — PX: ORIF WRIST FRACTURE: SHX2133

## 2020-06-01 LAB — COMPREHENSIVE METABOLIC PANEL
ALT: 27 U/L (ref 0–44)
AST: 28 U/L (ref 15–41)
Albumin: 4.3 g/dL (ref 3.5–5.0)
Alkaline Phosphatase: 53 U/L (ref 38–126)
Anion gap: 15 (ref 5–15)
BUN: 8 mg/dL (ref 6–20)
CO2: 23 mmol/L (ref 22–32)
Calcium: 9.4 mg/dL (ref 8.9–10.3)
Chloride: 106 mmol/L (ref 98–111)
Creatinine, Ser: 0.89 mg/dL (ref 0.61–1.24)
GFR, Estimated: >60 mL/min (ref >60)
Glucose, Bld: 96 mg/dL (ref 70–99)
Potassium: 4.3 mmol/L (ref 3.5–5.1)
Sodium: 144 mmol/L (ref 135–145)
Total Bilirubin: 0.6 mg/dL (ref 0.3–1.2)
Total Protein: 7.9 g/dL (ref 6.5–8.1)

## 2020-06-01 LAB — SARS CORONAVIRUS 2 BY RT PCR (HOSPITAL ORDER, PERFORMED IN ~~LOC~~ HOSPITAL LAB): SARS Coronavirus 2: NEGATIVE

## 2020-06-01 SURGERY — OPEN REDUCTION INTERNAL FIXATION (ORIF) WRIST FRACTURE
Anesthesia: Monitor Anesthesia Care | Site: Wrist | Laterality: Left

## 2020-06-01 MED ORDER — FENTANYL CITRATE (PF) 250 MCG/5ML IJ SOLN
INTRAMUSCULAR | Status: DC | PRN
  Administered 2020-06-01: 50 ug via INTRAVENOUS

## 2020-06-01 MED ORDER — PROMETHAZINE HCL 25 MG/ML IJ SOLN: 6.2500 mg | INTRAMUSCULAR | Status: DC | PRN

## 2020-06-01 MED ORDER — DEXAMETHASONE SODIUM PHOSPHATE 10 MG/ML IJ SOLN
INTRAMUSCULAR | Status: DC | PRN
  Administered 2020-06-01: 10 mg

## 2020-06-01 MED ORDER — PROPOFOL 1000 MG/100ML IV EMUL
INTRAVENOUS | Status: AC
  Filled 2020-06-01: qty 100

## 2020-06-01 MED ORDER — ORAL CARE MOUTH RINSE: 15.0000 mL | Freq: Once | OROMUCOSAL | Status: AC

## 2020-06-01 MED ORDER — PROPOFOL 10 MG/ML IV BOLUS
INTRAVENOUS | Status: AC
  Filled 2020-06-01: qty 20

## 2020-06-01 MED ORDER — ONDANSETRON HCL 4 MG/2ML IJ SOLN
INTRAMUSCULAR | Status: DC | PRN
  Administered 2020-06-01: 4 mg via INTRAVENOUS

## 2020-06-01 MED ORDER — PROPOFOL 10 MG/ML IV BOLUS
INTRAVENOUS | Status: DC | PRN
  Administered 2020-06-01: 10 mg via INTRAVENOUS
  Administered 2020-06-01: 20 mg via INTRAVENOUS
  Administered 2020-06-01: 10 mg via INTRAVENOUS
  Administered 2020-06-01: 20 mg via INTRAVENOUS

## 2020-06-01 MED ORDER — LACTATED RINGERS IV SOLN: INTRAVENOUS | Status: DC

## 2020-06-01 MED ORDER — PHENYLEPHRINE 40 MCG/ML (10ML) SYRINGE FOR IV PUSH (FOR BLOOD PRESSURE SUPPORT)
PREFILLED_SYRINGE | INTRAVENOUS | Status: AC
  Filled 2020-06-01: qty 30

## 2020-06-01 MED ORDER — PHENYLEPHRINE HCL-NACL 10-0.9 MG/250ML-% IV SOLN
INTRAVENOUS | Status: DC | PRN
  Administered 2020-06-01: 50 ug/min via INTRAVENOUS

## 2020-06-01 MED ORDER — ROPIVACAINE HCL 7.5 MG/ML IJ SOLN
INTRAMUSCULAR | Status: DC | PRN
  Administered 2020-06-01: 30 mL via PERINEURAL

## 2020-06-01 MED ORDER — PHENYLEPHRINE 40 MCG/ML (10ML) SYRINGE FOR IV PUSH (FOR BLOOD PRESSURE SUPPORT)
PREFILLED_SYRINGE | INTRAVENOUS | Status: DC | PRN
  Administered 2020-06-01: 80 ug via INTRAVENOUS
  Administered 2020-06-01: 40 ug via INTRAVENOUS
  Administered 2020-06-01 (×2): 80 ug via INTRAVENOUS
  Administered 2020-06-01 (×3): 40 ug via INTRAVENOUS

## 2020-06-01 MED ORDER — FENTANYL CITRATE (PF) 100 MCG/2ML IJ SOLN: 50.0000 ug | Freq: Once | INTRAMUSCULAR | Status: AC

## 2020-06-01 MED ORDER — CEFAZOLIN SODIUM-DEXTROSE 2-4 GM/100ML-% IV SOLN
2.0000 g | INTRAVENOUS | Status: AC
  Administered 2020-06-01: 2 g via INTRAVENOUS

## 2020-06-01 MED ORDER — FENTANYL CITRATE (PF) 100 MCG/2ML IJ SOLN: 25.0000 ug | INTRAMUSCULAR | Status: DC | PRN

## 2020-06-01 MED ORDER — MIDAZOLAM HCL 2 MG/2ML IJ SOLN: 2.0000 mg | Freq: Once | INTRAMUSCULAR | Status: AC

## 2020-06-01 MED ORDER — PROPOFOL 500 MG/50ML IV EMUL
INTRAVENOUS | Status: DC | PRN
  Administered 2020-06-01: 125 ug/kg/min via INTRAVENOUS

## 2020-06-01 MED ORDER — MIDAZOLAM HCL 2 MG/2ML IJ SOLN
INTRAMUSCULAR | Status: AC
  Administered 2020-06-01: 2 mg via INTRAVENOUS
  Filled 2020-06-01: qty 2

## 2020-06-01 MED ORDER — FENTANYL CITRATE (PF) 250 MCG/5ML IJ SOLN
INTRAMUSCULAR | Status: AC
  Filled 2020-06-01: qty 5

## 2020-06-01 MED ORDER — 0.9 % SODIUM CHLORIDE (POUR BTL) OPTIME
TOPICAL | Status: DC | PRN
  Administered 2020-06-01: 1000 mL

## 2020-06-01 MED ORDER — CHLORHEXIDINE GLUCONATE 0.12 % MT SOLN
OROMUCOSAL | Status: AC
  Administered 2020-06-01: 15 mL via OROMUCOSAL
  Filled 2020-06-01: qty 15

## 2020-06-01 MED ORDER — CHLORHEXIDINE GLUCONATE 0.12 % MT SOLN: 15.0000 mL | Freq: Once | OROMUCOSAL | Status: AC

## 2020-06-01 MED ORDER — PHENYLEPHRINE 40 MCG/ML (10ML) SYRINGE FOR IV PUSH (FOR BLOOD PRESSURE SUPPORT)
PREFILLED_SYRINGE | INTRAVENOUS | Status: AC
  Filled 2020-06-01: qty 10

## 2020-06-01 MED ORDER — ONDANSETRON HCL 4 MG/2ML IJ SOLN
INTRAMUSCULAR | Status: AC
  Filled 2020-06-01: qty 2

## 2020-06-01 MED ORDER — FENTANYL CITRATE (PF) 100 MCG/2ML IJ SOLN
INTRAMUSCULAR | Status: AC
  Administered 2020-06-01: 50 ug via INTRAVENOUS
  Filled 2020-06-01: qty 2

## 2020-06-01 SURGICAL SUPPLY — 66 items
BIT DRILL 2.2 SS TIBIAL (BIT) ×3 IMPLANT
BLADE CLIPPER SURG (BLADE) IMPLANT
BNDG ELASTIC 3X5.8 VLCR STR LF (GAUZE/BANDAGES/DRESSINGS) ×3 IMPLANT
BNDG ELASTIC 4X5.8 VLCR STR LF (GAUZE/BANDAGES/DRESSINGS) ×3 IMPLANT
BNDG ESMARK 4X9 LF (GAUZE/BANDAGES/DRESSINGS) ×3 IMPLANT
BNDG GAUZE ELAST 4 BULKY (GAUZE/BANDAGES/DRESSINGS) ×3 IMPLANT
CORD BIPOLAR FORCEPS 12FT (ELECTRODE) ×3 IMPLANT
COVER SURGICAL LIGHT HANDLE (MISCELLANEOUS) ×3 IMPLANT
COVER WAND RF STERILE (DRAPES) ×3 IMPLANT
CUFF TOURN SGL QUICK 18X4 (TOURNIQUET CUFF) ×3 IMPLANT
CUFF TOURN SGL QUICK 24 (TOURNIQUET CUFF)
CUFF TRNQT CYL 24X4X16.5-23 (TOURNIQUET CUFF) IMPLANT
DRAIN TLS ROUND 10FR (DRAIN) IMPLANT
DRAPE OEC MINIVIEW 54X84 (DRAPES) ×3 IMPLANT
DRAPE SURG 17X11 SM STRL (DRAPES) ×3 IMPLANT
DRIVER BIT SQUARE 1.7/2.2 (TRAUMA) ×6 IMPLANT
DRSG ADAPTIC 3X8 NADH LF (GAUZE/BANDAGES/DRESSINGS) ×3 IMPLANT
ELECT REM PT RETURN 9FT ADLT (ELECTROSURGICAL)
ELECTRODE REM PT RTRN 9FT ADLT (ELECTROSURGICAL) IMPLANT
GAUZE SPONGE 4X4 12PLY STRL (GAUZE/BANDAGES/DRESSINGS) ×3 IMPLANT
GAUZE SPONGE 4X4 12PLY STRL LF (GAUZE/BANDAGES/DRESSINGS) ×3 IMPLANT
GLOVE BIOGEL PI IND STRL 8.5 (GLOVE) ×1 IMPLANT
GLOVE BIOGEL PI INDICATOR 8.5 (GLOVE) ×2
GLOVE SURG ORTHO 8.0 STRL STRW (GLOVE) ×3 IMPLANT
GOWN STRL REUS W/ TWL LRG LVL3 (GOWN DISPOSABLE) ×3 IMPLANT
GOWN STRL REUS W/ TWL XL LVL3 (GOWN DISPOSABLE) ×1 IMPLANT
GOWN STRL REUS W/TWL LRG LVL3 (GOWN DISPOSABLE) ×6
GOWN STRL REUS W/TWL XL LVL3 (GOWN DISPOSABLE) ×2
K-WIRE 1.6 (WIRE) ×2
K-WIRE FX5X1.6XNS BN SS (WIRE) ×1
KIT BASIN OR (CUSTOM PROCEDURE TRAY) ×3 IMPLANT
KIT TURNOVER KIT B (KITS) ×3 IMPLANT
KWIRE FX5X1.6XNS BN SS (WIRE) ×1 IMPLANT
MANIFOLD NEPTUNE II (INSTRUMENTS) ×3 IMPLANT
NEEDLE HYPO 25X1 1.5 SAFETY (NEEDLE) IMPLANT
NS IRRIG 1000ML POUR BTL (IV SOLUTION) ×3 IMPLANT
PACK ORTHO EXTREMITY (CUSTOM PROCEDURE TRAY) ×3 IMPLANT
PAD ARMBOARD 7.5X6 YLW CONV (MISCELLANEOUS) ×6 IMPLANT
PAD CAST 3X4 CTTN HI CHSV (CAST SUPPLIES) ×1 IMPLANT
PAD CAST 4YDX4 CTTN HI CHSV (CAST SUPPLIES) ×1 IMPLANT
PADDING CAST COTTON 3X4 STRL (CAST SUPPLIES) ×2
PADDING CAST COTTON 4X4 STRL (CAST SUPPLIES) ×2
PEG LOCKING SMOOTH 2.2X22 (Screw) ×3 IMPLANT
PEG LOCKING SMOOTH 2.2X24 (Peg) ×6 IMPLANT
PEG LOCKING SMOOTH 2.2X26 (Peg) ×9 IMPLANT
PLATE STANDARD DVR LEFT (Plate) ×3 IMPLANT
PLATE STD DVR LT 24X51 (Plate) ×1 IMPLANT
SCREW LOCK 16X2.7X 3 LD TPR (Screw) ×2 IMPLANT
SCREW LOCK 18X2.7X 3 LD TPR (Screw) ×1 IMPLANT
SCREW LOCK 26X2.7X 3 LD TPR (Screw) ×1 IMPLANT
SCREW LOCKING 2.7X15MM (Screw) ×6 IMPLANT
SCREW LOCKING 2.7X16 (Screw) ×4 IMPLANT
SCREW LOCKING 2.7X18 (Screw) ×2 IMPLANT
SCREW LOCKING 2.7X26MM (Screw) ×2 IMPLANT
SOAP 2 % CHG 4 OZ (WOUND CARE) ×3 IMPLANT
SPLINT FIBERGLASS 3X35 (CAST SUPPLIES) ×3 IMPLANT
SUT PROLENE 4 0 PS 2 18 (SUTURE) ×6 IMPLANT
SUT VIC AB 2-0 FS1 27 (SUTURE) ×3 IMPLANT
SUT VICRYL 4-0 PS2 18IN ABS (SUTURE) ×3 IMPLANT
SYR CONTROL 10ML LL (SYRINGE) IMPLANT
SYSTEM CHEST DRAIN TLS 7FR (DRAIN) IMPLANT
TOWEL GREEN STERILE (TOWEL DISPOSABLE) ×3 IMPLANT
TOWEL GREEN STERILE FF (TOWEL DISPOSABLE) ×3 IMPLANT
TUBE CONNECTING 12'X1/4 (SUCTIONS) ×1
TUBE CONNECTING 12X1/4 (SUCTIONS) ×2 IMPLANT
WATER STERILE IRR 1000ML POUR (IV SOLUTION) ×3 IMPLANT

## 2020-06-01 NOTE — Anesthesia Postprocedure Evaluation (Signed)
Anesthesia Post Note  Patient: Roberto Rogers  Procedure(s) Performed: OPEN REDUCTION INTERNAL FIXATION (ORIF) WRIST FRACTURE (Left Wrist)     Patient location during evaluation: PACU Anesthesia Type: Regional Level of consciousness: awake and alert and awake Pain management: pain level controlled Vital Signs Assessment: post-procedure vital signs reviewed and stable Respiratory status: spontaneous breathing, nonlabored ventilation, respiratory function stable and patient connected to nasal cannula oxygen Cardiovascular status: stable and blood pressure returned to baseline Postop Assessment: no apparent nausea or vomiting Anesthetic complications: no   No complications documented.  Last Vitals:  Vitals:   06/01/20 1700 06/01/20 1830  BP: (!) 160/95 (!) 152/88  Pulse: 90 84  Resp: 19 (!) 23  Temp:  36.6 C  SpO2: 96% 98%    Last Pain:  Vitals:   06/01/20 1830  TempSrc:   PainSc: 0-No pain                 Cecile Hearing

## 2020-06-01 NOTE — Anesthesia Preprocedure Evaluation (Addendum)
Anesthesia Evaluation  Patient identified by MRN, date of birth, ID band Patient awake    Reviewed: Allergy & Precautions, NPO status , Patient's Chart, lab work & pertinent test results  Airway Mallampati: II  TM Distance: >3 FB Neck ROM: Full    Dental  (+) Dental Advisory Given, Missing   Pulmonary sleep apnea , Current SmokerPatient did not abstain from smoking.,    Pulmonary exam normal breath sounds clear to auscultation       Cardiovascular negative cardio ROS Normal cardiovascular exam Rhythm:Regular Rate:Normal     Neuro/Psych negative neurological ROS  negative psych ROS   GI/Hepatic negative GI ROS, Neg liver ROS,   Endo/Other  negative endocrine ROS  Renal/GU negative Renal ROS     Musculoskeletal negative musculoskeletal ROS (+)   Abdominal   Peds  Hematology negative hematology ROS (+)   Anesthesia Other Findings Day of surgery medications reviewed with the patient.  Reproductive/Obstetrics                            Anesthesia Physical Anesthesia Plan  ASA: II  Anesthesia Plan: Regional and MAC   Post-op Pain Management:    Induction: Intravenous  PONV Risk Score and Plan: 0 and Propofol infusion and Treatment may vary due to age or medical condition  Airway Management Planned: Nasal Cannula and Natural Airway  Additional Equipment:   Intra-op Plan:   Post-operative Plan:   Informed Consent: I have reviewed the patients History and Physical, chart, labs and discussed the procedure including the risks, benefits and alternatives for the proposed anesthesia with the patient or authorized representative who has indicated his/her understanding and acceptance.     Dental advisory given  Plan Discussed with:   Anesthesia Plan Comments:         Anesthesia Quick Evaluation

## 2020-06-01 NOTE — H&P (Addendum)
Roberto Rogers is an 55 y.o. male.   Chief Complaint: Left wrist injury. HPI: Roberto Rogers is a 55 y.o. male presents with concern for left wrist pain and deformity after tripping going down the steps. Patient states that his shoe got caught, fell forward onto the concrete, catching himself with his outstretched left arm. Immediately had pain and deformity of the wrist.  Patient was seen and evaluated yesterday in the emergency department.  The patient is here today for surgery.  He is right-handed   Past Medical History:  Diagnosis Date  . History of kidney stones     Past Surgical History:  Procedure Laterality Date  . HERNIA REPAIR      No family history on file. Social History:  reports that he has been smoking. He has been smoking about 1.00 pack per day. He has never used smokeless tobacco. He reports current alcohol use. He reports previous drug use.  Allergies: No Known Allergies  Medications Prior to Admission  Medication Sig Dispense Refill  . acetaminophen (TYLENOL) 500 MG tablet Take 1,000 mg by mouth as needed for moderate pain.    Marland Kitchen HYDROcodone-acetaminophen (NORCO) 5-325 MG tablet Take 1 tablet by mouth every 6 (six) hours as needed for moderate pain. (Patient not taking: Reported on 06/01/2020) 6 tablet 0  . ondansetron (ZOFRAN ODT) 8 MG disintegrating tablet Take 1 tablet (8 mg total) by mouth every 8 (eight) hours as needed for nausea or vomiting. (Patient not taking: Reported on 06/01/2020) 8 tablet 0    Results for orders placed or performed during the hospital encounter of 06/01/20 (from the past 48 hour(s))  SARS Coronavirus 2 by RT PCR (hospital order, performed in [facility] hospital lab) Nasopharyngeal Nasopharyngeal Swab     Status: None   Collection Time: 06/01/20 10:24 AM   Specimen: Nasopharyngeal Swab  Result Value Ref Range   SARS Coronavirus 2 NEGATIVE NEGATIVE    Comment: (NOTE) SARS-CoV-2 target nucleic acids are NOT DETECTED.  The SARS-CoV-2 RNA is  generally detectable in upper and lower respiratory specimens during the acute phase of infection. The lowest concentration of SARS-CoV-2 viral copies this assay can detect is 250 copies / mL. A negative result does not preclude SARS-CoV-2 infection and should not be used as the sole basis for treatment or other patient management decisions.  A negative result may occur with improper specimen collection / handling, submission of specimen other than nasopharyngeal swab, presence of viral mutation(s) within the areas targeted by this assay, and inadequate number of viral copies (<250 copies / mL). A negative result must be combined with clinical observations, patient history, and epidemiological information.  Fact Sheet for Patients:   BoilerBrush.com.cy  Fact Sheet for Healthcare Providers: https://pope.com/  This test is not yet approved or  cleared by the [place] FDA and has been authorized for detection and/or diagnosis of SARS-CoV-2 by FDA under an Emergency Use Authorization (EUA).  This EUA will remain in effect (meaning this test can be used) for the duration of the COVID-19 declaration under Section 564(b)(1) of the Act, 21 U.S.C. section 360bbb-3(b)(1), unless the authorization is terminated or revoked sooner.  Performed at [facility], [address], [place], [place] [address]   Comprehensive metabolic panel     Status: None   Collection Time: 06/01/20 11:43 AM  Result Value Ref Range   Sodium 144 135 - 145 mmol/L   Potassium 4.3 3.5 - 5.1 mmol/L   Chloride 106 98 - 111  mmol/L   CO2 23 22 - 32 mmol/L   Glucose, Bld 96 70 - 99 mg/dL    Comment: Glucose reference range applies only to samples taken after fasting for at least 8 hours.   BUN 8 6 - 20 mg/dL   Creatinine, Ser 0.89 0.61 - 1.24 mg/dL   Calcium 9.4 8.9 - 10.3 mg/dL   Total Protein 7.9 6.5 - 8.1 g/dL   Albumin 4.3 3.5 - 5.0 g/dL   AST 28 15 - 41  U/L   ALT 27 0 - 44 U/L   Alkaline Phosphatase 53 38 - 126 U/L   Total Bilirubin 0.6 0.3 - 1.2 mg/dL   GFR, Estimated >60 >60 mL/min    Comment: (NOTE) Calculated using the CKD-EPI Creatinine Equation (2021)    Anion gap 15 5 - 15    Comment: Performed at [facility] [address], [place], [place] [address]   DG Wrist Complete Left  Result Date: 05/31/2020 CLINICAL DATA:  Tripped and fell today landing on the outstretched left hand. Left wrist pain. EXAM: LEFT WRIST - COMPLETE 3+ VIEW COMPARISON:  None. FINDINGS: Comminuted, intra-articular fracture of the distal radius. There are transverse and oblique fracture lines. Fractures intersect the articular surface between the lunate and scaphoid facets of the distal radius. Full low fracture components are displaced anteriorly by approximately 6 mm, carpus displacing with the volar fragment component. Small associated ulnar styloid fracture. Joints are normally spaced and aligned. There is surrounding soft tissue swelling. IMPRESSION: 1. Barton's fracture of the distal radius, with intra-articular extension and with mild volar displacement of approximately 6 mm. Small associated ulnar styloid fracture. No dislocation. Electronically Signed   By: Lajean Manes M.D.   On: 05/31/2020 11:16    ROS The patient denies any chest pain, nausea, vomiting, diarrhea, no Gastrointestinal or Cardiopulmonary complaints.No other systems in review of systems are positive. All negative review of systems. No recent hospitalizations or illnesses.  Blood pressure 122/88, pulse 90, temperature 98.5 F (36.9 C), temperature source Oral, resp. rate 20, SpO2 100 %. Physical Exam  General Appearance:  Alert, cooperative, no distress, appears stated age  Head:  Normocephalic, without obvious abnormality, atraumatic  Eyes:  Pupils equal, conjunctiva/corneas clear,         Throat: Lips, mucosa, and tongue normal; teeth and gums normal  Neck: No visible masses      Lungs:   respirations unlabored  Chest Wall:  No tenderness or deformity  Heart:  Regular rate and rhythm,  Abdomen:   Soft, non-tender,         Extremities:  The patient does have the obvious deformity and swelling to the volar radius.  Patient is able to extend his thumb and extend his digits gently wiggle his fingers his fingers are warm well perfused  Pulses: 2+ and symmetric  Skin: Skin color, texture, turgor normal, no rashes or lesions     Neurologic: Normal    Assessment/Plan Left wrist comminuted intra-articular distal radius fracture 3 more fragments  Left wrist open reduction and internal fixation and repair as indicated  R/B/A DISCUSSED WITH PT IN HOSPITAL.  PT VOICED UNDERSTANDING OF PLAN CONSENT SIGNED DAY OF SURGERY PT SEEN AND EXAMINED PRIOR TO OPERATIVE PROCEDURE/DAY OF SURGERY SITE MARKED. QUESTIONS ANSWERED WILL GO HOME FOLLOWING SURGERY  WE ARE PLANNING SURGERY FOR YOUR UPPER EXTREMITY. THE RISKS AND BENEFITS OF SURGERY INCLUDE BUT NOT LIMITED TO BLEEDING INFECTION, DAMAGE TO NEARBY NERVES ARTERIES TENDONS, FAILURE OF SURGERY  TO ACCOMPLISH ITS INTENDED GOALS, PERSISTENT SYMPTOMS AND NEED FOR FURTHER SURGICAL INTERVENTION. WITH THIS IN MIND WE WILL PROCEED. I HAVE DISCUSSED WITH THE PATIENT THE PRE AND POSTOPERATIVE REGIMEN AND THE DOS AND DON'TS. PT VOICED UNDERSTANDING AND INFORMED CONSENT SIGNED.  Sharma Covert 06/01/2020, 1:20 PM

## 2020-06-01 NOTE — Anesthesia Procedure Notes (Signed)
Anesthesia Regional Block: Supraclavicular block   Pre-Anesthetic Checklist: ,, timeout performed, Correct Patient, Correct Site, Correct Laterality, Correct Procedure, Correct Position, site marked, Risks and benefits discussed,  Surgical consent,  Pre-op evaluation,  At surgeon's request and post-op pain management  Laterality: Left  Prep: chloraprep       Needles:  Injection technique: Single-shot  Needle Type: Echogenic Needle     Needle Length: 9cm  Needle Gauge: 21     Additional Needles:   Procedures:,,,, ultrasound used (permanent image in chart),,,,  Narrative:  Start time: 06/01/2020 12:40 PM End time: 06/01/2020 12:50 PM Injection made incrementally with aspirations every 5 mL.  Performed by: Personally  Anesthesiologist: Cecile Hearing, MD  Additional Notes: No pain on injection. No increased resistance to injection. Injection made in 5cc increments.  Good needle visualization.  Patient tolerated procedure well.

## 2020-06-01 NOTE — Telephone Encounter (Signed)
Dr. Melvyn Novas unable to prescribe pain medicine for this patient at the time of discharge. Suspects epic issues. He is requesting [place] to prescrive norco 10-325 mg po qid prn pain x 28. I will do so in this case.id prn pain x 28. I will do so in this case.

## 2020-06-01 NOTE — Telephone Encounter (Signed)
Dr. Melvyn Novas unable to prescribe pain medicine for this patient at the time of discharge. Suspects epic issues. He is requesting [place] to prescrive norco 10-325 mg po qid prn pain x 28. I will do so in this case.

## 2020-06-01 NOTE — Discharge Instructions (Signed)
KEEP BANDAGE CLEAN AND DRY °CALL OFFICE FOR F/U APPT [phone] °KEEP HAND ELEVATED ABOVE HEART °OK TO APPLY ICE TO OPERATIVE AREA °CONTACT OFFICE IF ANY WORSENING PAIN OR CONCERNS. °

## 2020-06-01 NOTE — Anesthesia Procedure Notes (Signed)
Procedure Name: MAC Date/Time: 06/01/2020 1:55 PM Performed by: Janene Harvey, CRNA Pre-anesthesia Checklist: Patient identified, Emergency Drugs available, Suction available and Patient being monitored Patient Re-evaluated:Patient Re-evaluated prior to induction Oxygen Delivery Method: Simple face mask Induction Type: IV induction Placement Confirmation: positive ETCO2 Dental Injury: Teeth and Oropharynx as per pre-operative assessment

## 2020-06-01 NOTE — Op Note (Signed)
PREOPERATIVE DIAGNOSIS:Left wrist intra-articular distal radius fracture 3 more fragments  POSTOPERATIVE DIAGNOSIS: Same  ATTENDING SURGEON: Dr. Bradly Bienenstock who scrubbed and present for the entire procedure  ASSISTANT SURGEON: None  ANESTHESIA: Regional with IV sedation  OPERATIVE PROCEDURE: #1: Open treatment of left wrist intra-articular distal radius fracture 3 more fragments #2: Left wrist brachial radialis tendon release, tendon tenotomy #3: Radiographs 3 views left wrist  IMPLANTS: Biomet standard DVR cross lock left  EBL: Minimal  RADIOGRAPHIC INTERPRETATION: AP lateral oblique views of the wrist do show the volar plate fixation in place with good alignment of the radial height inclination and tilt  SURGICAL INDICATIONS: Patient is a right-hand-dominant male who was involved in a fall.  Patient sustained a closed injury to the left distal radius.  Patient was seen and evaluated in the office and recommended undergo the above procedure.  The risks of surgery include but not limited to bleeding infection damage nearby nerves arteries or tendons loss of motion of the wrist and digits incomplete relief of symptoms and need for further surgical invention.  SURGICAL TECHNIQUE: The patient was prepped identified in the preoperative holding area marked permanent marker made on the left wrist indicate correct operative site.  The patient brought back operating placed supine on anesthesia table where the regional anesthetic was administered.  Patient tolerated this well.  A well-padded tourniquet was then placed on the left brachium and seal with the appropriate drape.  The left upper extremity was then prepped and draped normal sterile fashion.  A timeout was called the correct site was identified procedure then begun.  Attention then turned to the left wrist the limb was then elevated tourniquet insufflated.  Dissection carried down through the skin and subcutaneous tissue.   Preoperative antibiotics were given prior to skin incision.  A longitudinal incision made directly over the FCR sheath.  Dissection carried down through the skin and subcutaneous tissue.  The FCR sheath was then opened proximally distally.  Going through the floor the FCR sheath the FPL was then carefully swept out of the way.  The pronator quadratus was then elevated in an L-shaped fashion.  Exposure of the fracture site was then carried out.  This was an intra-articular fracture of 3 more fragments.  In order to reduce the radial column careful attention was then turned to separate procedure to release the brachial radialis off the radial styloid.  Tendon tenotomy was carried out.  The wound was then thoroughly irrigated.  The fracture hematoma was then evacuated.  The volar plate was then applied.  Once this was carried out was held in place distally with a K wire.  Confirmation of plate position and over alignment was confirmed using the mini C arm.  Once this was carried out the oblong screw was then placed proximally.  Distal fixation was then carried out with the distal locking pegs and screws from a ulnar to radial direction.  The K wire was then removed.  Following this the proximal screws were then filled with locking and nonlocking bicortical screws.  The wound was then thoroughly irrigated.  Final radiographs were then obtained.  The pronator quadratus was then closed with 2-0 Vicryl.  The subcutaneous tissues closed with 4-0 Vicryl.  The skin was then closed using simple 4-0 Prolene suture.  Adaptic dressing a sterile compressive bandage then applied.  The patient then placed in a well-padded sugar tong splint taken recovery in good condition.  POSTOPERATIVE PLAN: Patient be discharged to home.  See him back in the office in 2 weeks for wound check suture removal x-rays application of short arm cast.  Placed the therapy order to begin at the 4-week mark.  Radiographs at each visit.

## 2020-06-01 NOTE — Transfer of Care (Signed)
Immediate Anesthesia Transfer of Care Note  Patient: Roberto Rogers  Procedure(s) Performed: OPEN REDUCTION INTERNAL FIXATION (ORIF) WRIST FRACTURE (Left Wrist)  Patient Location: PACU  Anesthesia Type:MAC  Level of Consciousness: awake, alert  and oriented  Airway & Oxygen Therapy: Patient connected to face mask oxygen  Post-op Assessment: Post -op Vital signs reviewed and stable  Post vital signs: stable  Last Vitals:  Vitals Value Taken Time  BP 143/91 06/01/20 1528  Temp    Pulse 78 06/01/20 1527  Resp 18 06/01/20 1528  SpO2 100 % 06/01/20 1527  Vitals shown include unvalidated device data.  Last Pain:  Vitals:   06/01/20 1125  TempSrc: Oral  PainSc:       Patients Stated Pain Goal: 4 (42/10/31 2811)  Complications: No complications documented.

## 2020-06-03 ENCOUNTER — Encounter (HOSPITAL_COMMUNITY): Payer: Self-pay | Admitting: Orthopedic Surgery

## 2021-04-16 ENCOUNTER — Ambulatory Visit: Payer: BC Managed Care – PPO | Admitting: Podiatry

## 2021-04-16 ENCOUNTER — Other Ambulatory Visit: Payer: Self-pay

## 2021-04-16 DIAGNOSIS — L989 Disorder of the skin and subcutaneous tissue, unspecified: Secondary | ICD-10-CM

## 2021-04-16 DIAGNOSIS — Q666 Other congenital valgus deformities of feet: Secondary | ICD-10-CM | POA: Diagnosis not present

## 2021-04-18 ENCOUNTER — Encounter: Payer: Self-pay | Admitting: Podiatry

## 2021-04-18 NOTE — Progress Notes (Signed)
  Subjective:  Patient ID: Roberto Rogers, male    DOB: March 30, 1966,  MRN: [id number]  Chief Complaint  Patient presents with   Callouses    Bottom of left foot possible callus causing discomfort     55 y.o. male presents with the above complaint.  Patient presents with complaint of left midfoot porokeratotic/benign skin lesion lesion.  Patient states is very painful to touch painful to walk on.  Has progressed to gotten worse.  Is causing him a lot of discomfort.  He would like to get it evaluated and removed if possible.  He walks a lot on his foot and has been hurting him especially for his the right way.  His pain scale is 5 out of 10 sharp shooting in nature.  He also does not wear any orthotics and he works all day on his feet.   Review of Systems: Negative except as noted in the HPI. Denies N/V/F/Ch.  Past Medical History:  Diagnosis Date   History of kidney stones     Current Outpatient Medications:    ondansetron (ZOFRAN ODT) 8 MG disintegrating tablet, Take 1 tablet (8 mg total) by mouth every 8 (eight) hours as needed for nausea or vomiting. (Patient not taking: Reported on 06/01/2020), Disp: 8 tablet, Rfl: 0  Social History   Tobacco Use  Smoking Status Every Day   Packs/day: 1.00   Types: Cigarettes  Smokeless Tobacco Never    No Known Allergies Objective:  There were no vitals filed for this visit. There is no height or weight on file to calculate BMI. Constitutional Well developed. Well nourished.  Vascular Dorsalis pedis pulses palpable bilaterally. Posterior tibial pulses palpable bilaterally. Capillary refill normal to all digits.  No cyanosis or clubbing noted. Pedal hair growth normal.  Neurologic Normal speech. Oriented to person, place, and time. Epicritic sensation to light touch grossly present bilaterally.  Dermatologic Porokeratotic lesion/benign skin lesion noted to the left plantar midfoot.  Pain on palpation.  Gait examination shows pes planovalgus  foot structure with calcaneovalgus to many toe signs partially but recruit the arch with dorsiflexion of the hallux.  Unable to perform single and double heel raise.  Orthopedic: Normal joint ROM without pain or crepitus bilaterally. No visible deformities. No bony tenderness.   Radiographs: None Assessment:   1. Benign skin lesion   2. Pes planovalgus    Plan:  Patient was evaluated and treated and all questions answered.  Left midfoot benign skin lesion/porokeratosis -I explained to the patient the etiology of porokeratosis and various treatment options were extensively discussed.  Given the amount of pain that is having I believe would benefit from debridement of the lesion.  Using chisel blade handle the lesion was debrided down to healthy striated tissue followed by excision of central nucleated core.  Patient had immediate relief.  No complication noted no pinpoint bleeding noted.  Pes planovalgus -I explained to patient the etiology of pes planovalgus and relationship with midfoot porokeratosis and tight plantar fascia and various treatment options were discussed.  Given patient foot structure in the setting of Planter fasciitis I believe patient will benefit from custom-made orthotics to help control the hindfoot motion support the arch of the foot and take the stress away from plantar fascial.  Patient agrees with the plan like to proceed with orthotics -Patient was casted for orthotics   No follow-ups on file.

## 2021-05-06 ENCOUNTER — Telehealth: Payer: Self-pay | Admitting: Podiatry

## 2021-05-06 NOTE — Telephone Encounter (Signed)
Orthotics in.. lvm for pt to call to schedule an appt to pick them up. °

## 2021-05-28 ENCOUNTER — Ambulatory Visit: Payer: BC Managed Care – PPO | Admitting: Podiatry

## 2021-05-28 ENCOUNTER — Other Ambulatory Visit: Payer: Self-pay

## 2021-05-28 DIAGNOSIS — L989 Disorder of the skin and subcutaneous tissue, unspecified: Secondary | ICD-10-CM | POA: Diagnosis not present

## 2021-05-30 ENCOUNTER — Encounter: Payer: Self-pay | Admitting: Podiatry

## 2021-05-30 NOTE — Progress Notes (Signed)
°  Subjective:  Patient ID: Roberto Rogers, male    DOB: 10-Jul-1965,  MRN: [id number]  Chief Complaint  Patient presents with   Callouses    55 y.o. male presents with the above complaint.  Patient presents with complaint of left midfoot porokeratotic/benign skin lesion lesion.  He states it came back and is hurting him.  Debridement does help but has not gotten rid of the pain.  He is also here to pick up his orthotics.  He would like to have the lesion taken out completely.   Review of Systems: Negative except as noted in the HPI. Denies N/V/F/Ch.  Past Medical History:  Diagnosis Date   History of kidney stones     Current Outpatient Medications:    ondansetron (ZOFRAN ODT) 8 MG disintegrating tablet, Take 1 tablet (8 mg total) by mouth every 8 (eight) hours as needed for nausea or vomiting. (Patient not taking: Reported on 06/01/2020), Disp: 8 tablet, Rfl: 0  Social History   Tobacco Use  Smoking Status Every Day   Packs/day: 1.00   Types: Cigarettes  Smokeless Tobacco Never    No Known Allergies Objective:  There were no vitals filed for this visit. There is no height or weight on file to calculate BMI. Constitutional Well developed. Well nourished.  Vascular Dorsalis pedis pulses palpable bilaterally. Posterior tibial pulses palpable bilaterally. Capillary refill normal to all digits.  No cyanosis or clubbing noted. Pedal hair growth normal.  Neurologic Normal speech. Oriented to person, place, and time. Epicritic sensation to light touch grossly present bilaterally.  Dermatologic Porokeratotic lesion/benign skin lesion noted to the left plantar midfoot.  Pain on palpation.  Gait examination shows pes planovalgus foot structure with calcaneovalgus to many toe signs partially but recruit the arch with dorsiflexion of the hallux.  Unable to perform single and double heel raise.  Orthopedic: Normal joint ROM without pain or crepitus bilaterally. No visible deformities. No  bony tenderness.   Radiographs: None Assessment:   1. Benign skin lesion     Plan:  Patient was evaluated and treated and all questions answered.  Left midfoot benign skin lesion/porokeratosis -I explained to the patient the etiology of porokeratosis and various treatment options were extensively discussed.   -Given that it is recurring and debridement has not helped.  I believe patient will benefit from punch excision of the lesion. Excision of the skin lesion Skin was prepped in standard technique with Betadine.  One-to-one mixture of 1% lidocaine plain half percent Marcaine plain was injected circumferentially in V-block fashion 3 cc.  3 mm punch biopsy was utilized to remove the lesion in its entirety down to the level of the subcutaneous tissue.  The wound site was dressed with triple antibiotic 4 x 4 gauze Kerlix and Ace bandage.  I have asked the patient to keep it covered with Neosporin and a Band-Aid until healing.  I will see her back again in 6 weeks   Pes planovalgus -I explained to patient the etiology of pes planovalgus and relationship with midfoot porokeratosis and tight plantar fascia and various treatment options were discussed.  Given patient foot structure in the setting of Planter fasciitis I believe patient will benefit from custom-made orthotics to help control the hindfoot motion support the arch of the foot and take the stress away from plantar fascial.  Patient agrees with the plan like to proceed with orthotics -Orthotics were dispensed and is functioning well   No follow-ups on file.

## 2021-07-16 ENCOUNTER — Ambulatory Visit: Payer: BC Managed Care – PPO | Admitting: Podiatry

## 2022-08-20 IMAGING — CT CT RENAL STONE PROTOCOL
2 of 4 series · 16 of 46 positions shown, 18 images · non-contrast
Comparison: None.

CLINICAL DATA: Right lower quadrant pain

EXAM:
CT ABDOMEN AND PELVIS WITHOUT CONTRAST
TECHNIQUE: Multidetector CT imaging of the abdomen and pelvis was performed
following the standard protocol without IV contrast.

[Series 2: axial st · axial · 0.76mm/px · z∈[+1290,+1685]mm · 13 of 91 slices shown, 15 images]
[im 6/91  soft-tissue]
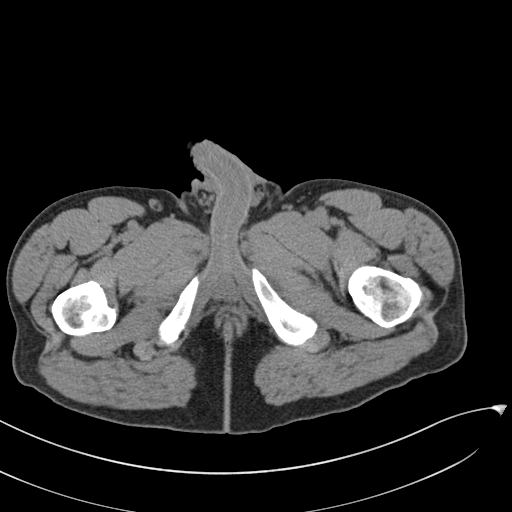
[im 6/91  bone]
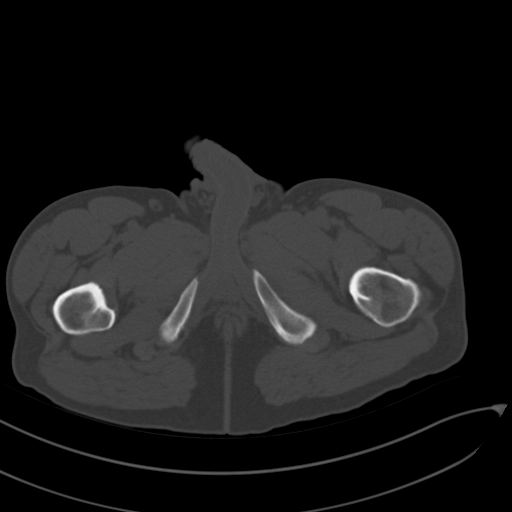
[im 11/91  soft-tissue]
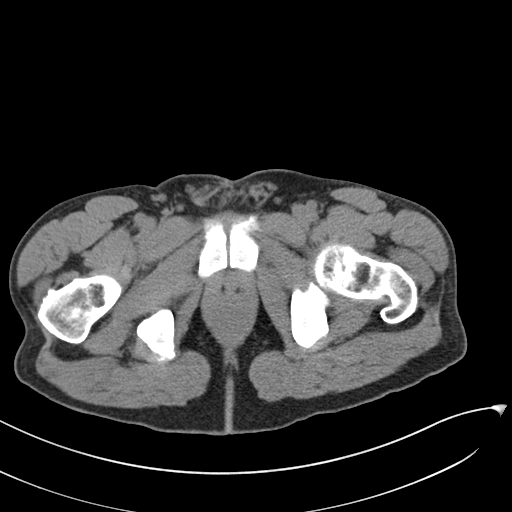
[im 22/91  soft-tissue]
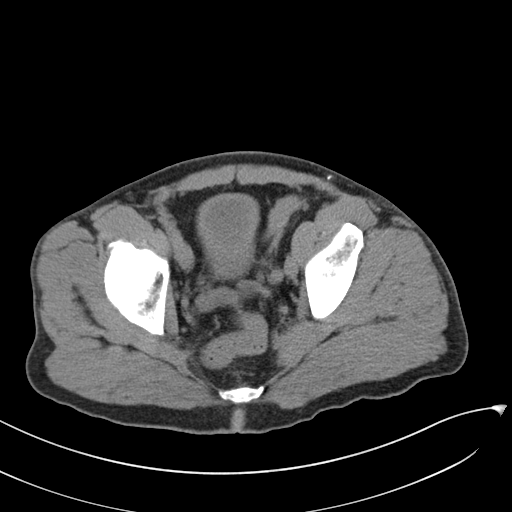
[im 27/91  soft-tissue]
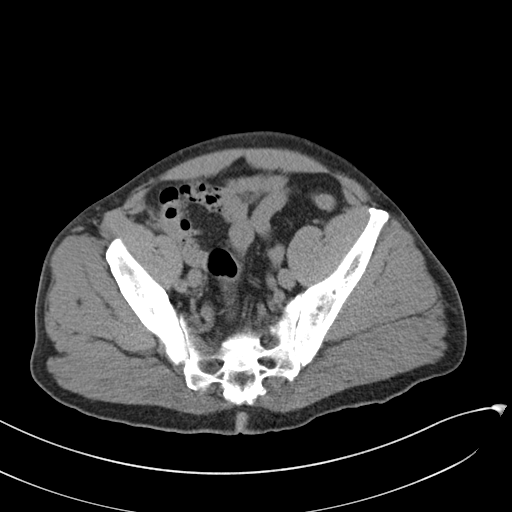
[im 32/91  soft-tissue]
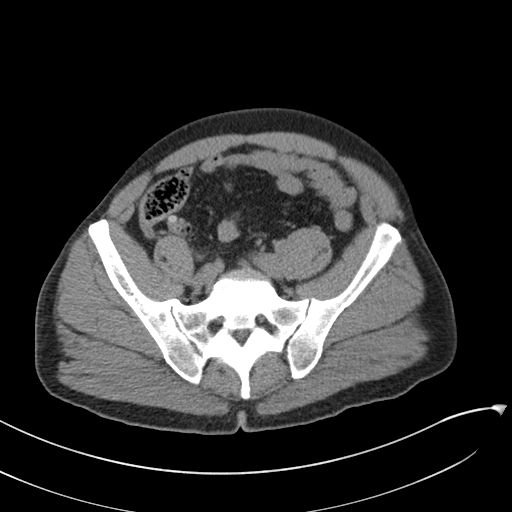
[im 38/91  soft-tissue]
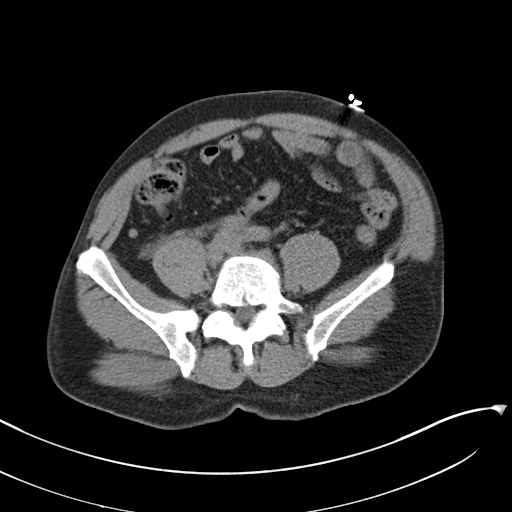
[im 48/91  soft-tissue]
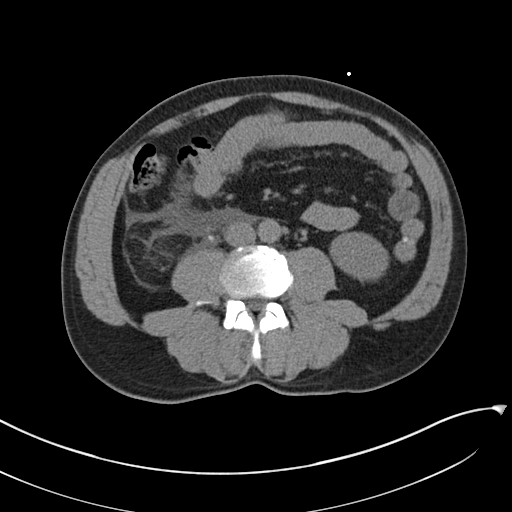
[im 53/91  soft-tissue]
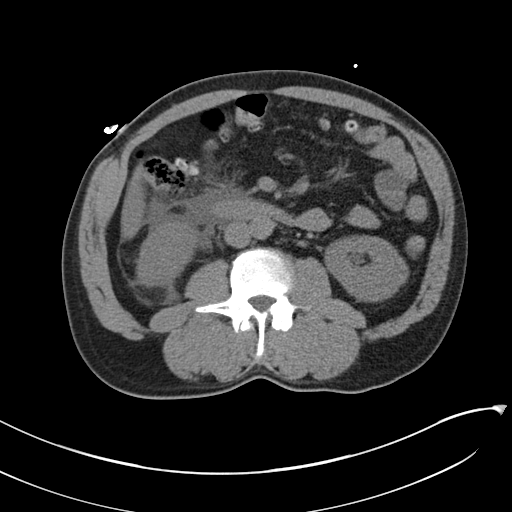
[im 59/91  soft-tissue]
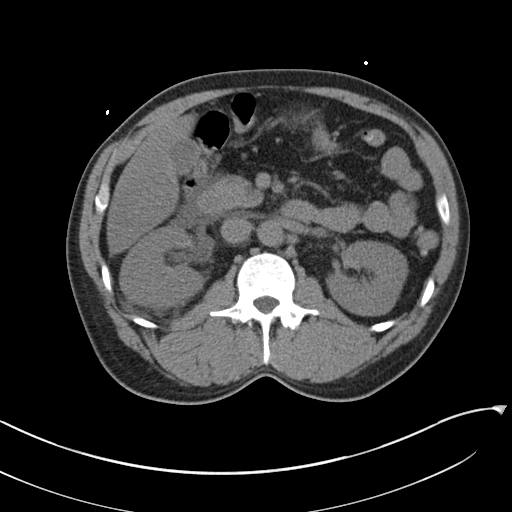
[im 59/91  bone]
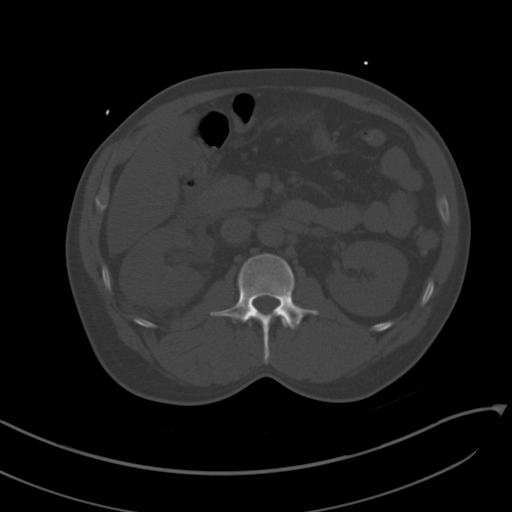
[im 64/91  soft-tissue]
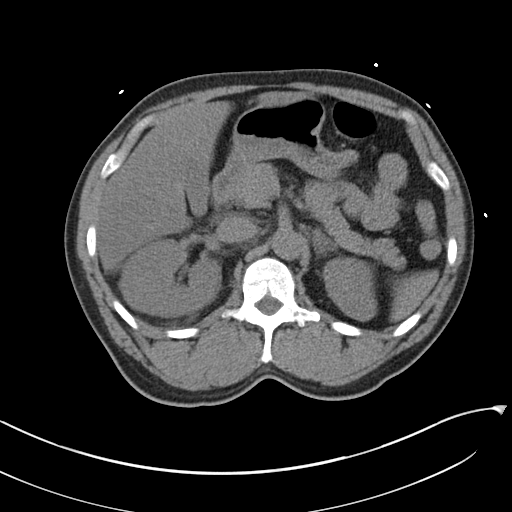
[im 69/91  soft-tissue]
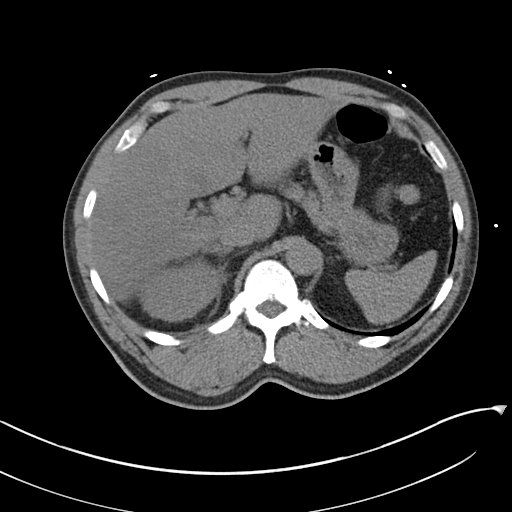
[im 80/91  soft-tissue]
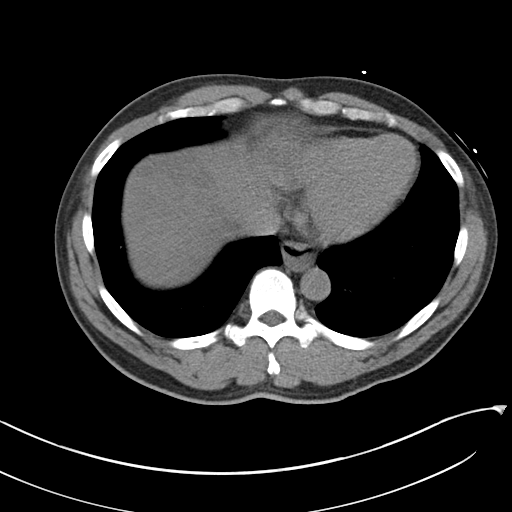
[im 85/91  soft-tissue]
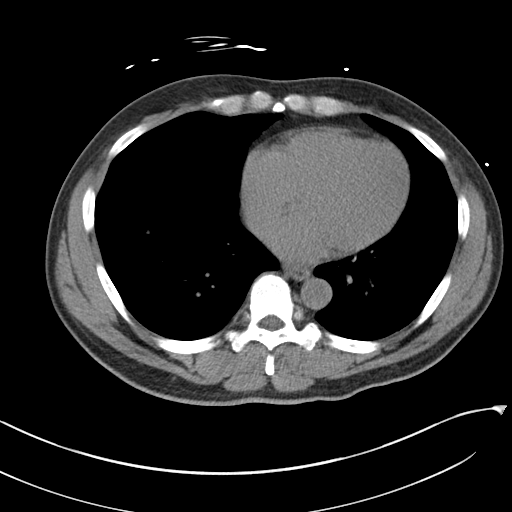

[Series 4: coronal · coronal · 0.71mm/px · 3 of 140 slices shown]
[im 47/140  soft-tissue]
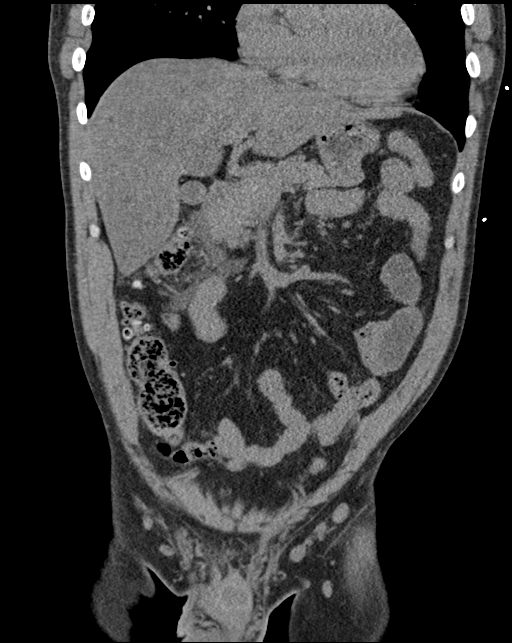
[im 62/140  soft-tissue]
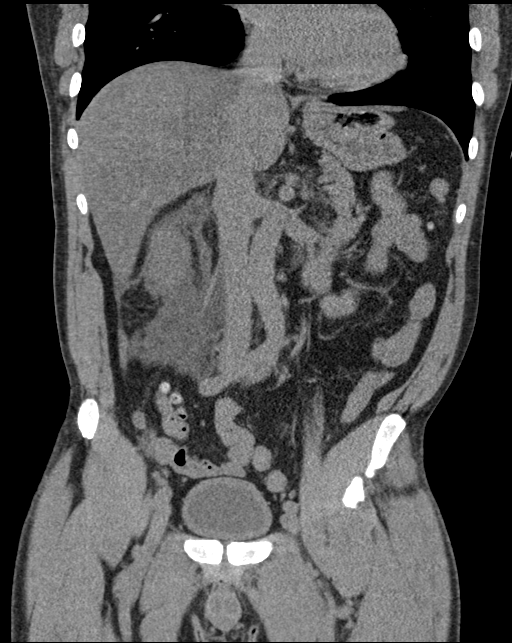
[im 78/140  soft-tissue]
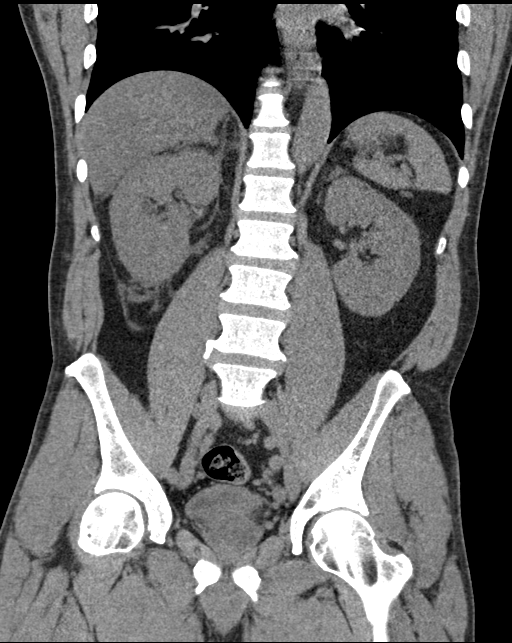

[16 of 46 positions shown; findings below may reference images not displayed]

FINDINGS: Lower chest: The lung bases are clear. The heart size is normal.

Hepatobiliary: There is decreased hepatic attenuation suggestive of
hepatic steatosis. Normal gallbladder.There is no biliary ductal
dilation.

Pancreas: Normal contours without ductal dilatation. No
peripancreatic fluid collection.

Spleen: Unremarkable.

Adrenals/Urinary Tract:

--Adrenal glands: Unremarkable.

--Right kidney/ureter: There is mild-to-moderate right-sided
hydronephrosis secondary to an obstructing 4 mm stone in the mid to
proximal right ureter (axial series 2, image 43). There is a
nonobstructing stone in the lower pole the right kidney measuring
approximately 5 mm.

--Left kidney/ureter: No hydronephrosis or radiopaque kidney stones.

--Urinary bladder: Unremarkable.

Stomach/Bowel:

--Stomach/Duodenum: No hiatal hernia or other gastric abnormality.
Normal duodenal course and caliber.

--Small bowel: Unremarkable.

--Colon: There are scattered colonic diverticula without CT evidence
for diverticulitis.

--Appendix: Normal.

Vascular/Lymphatic: Normal course and caliber of the major abdominal
vessels.

--No retroperitoneal lymphadenopathy.

--No mesenteric lymphadenopathy.

--No pelvic or inguinal lymphadenopathy.

Reproductive: Unremarkable

Other: No ascites or free air. The abdominal wall is normal.

Musculoskeletal. No acute displaced fractures.
IMPRESSION: 1. Right-sided obstructive uropathy secondary to an obstructing 4 mm
stone in the mid to proximal right ureter.
2. Nonobstructive right nephrolithiasis.
3. Hepatic steatosis.
4. Scattered colonic diverticula without CT evidence for
diverticulitis.

## 2022-11-29 IMAGING — CR DG WRIST COMPLETE 3+V*L*
3 series · 3 of 3 positions shown · non-contrast
Comparison: None.

CLINICAL DATA: Tripped and fell today landing on the outstretched
left hand. Left wrist pain.

EXAM:
LEFT WRIST - COMPLETE 3+ VIEW

[x wrist pa left]
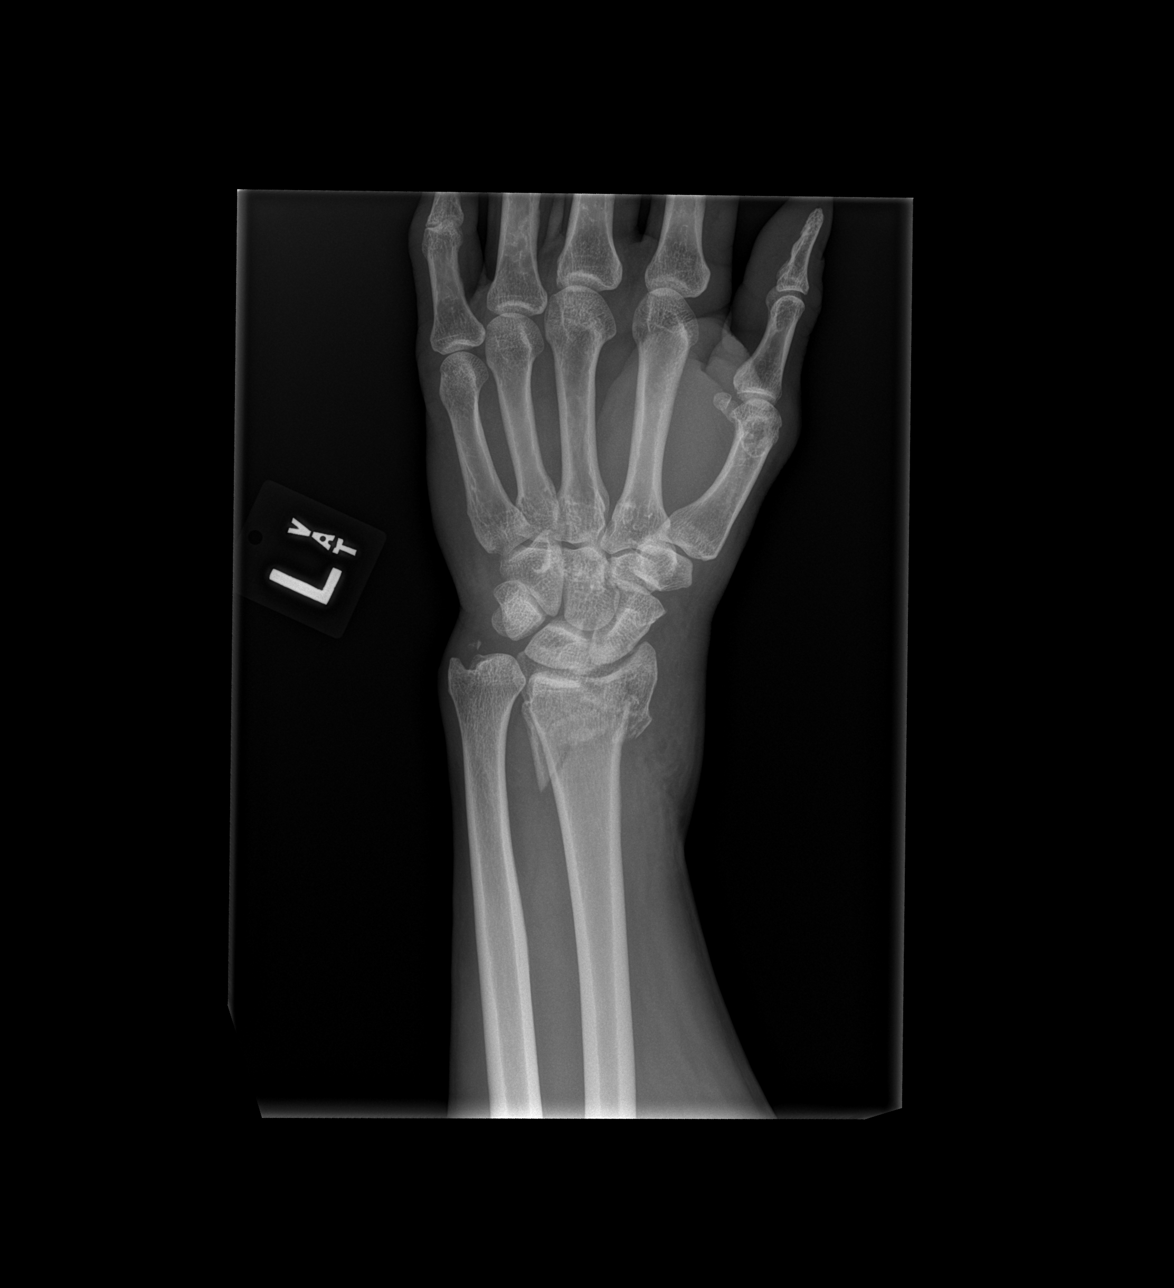

[x wrist obl left]
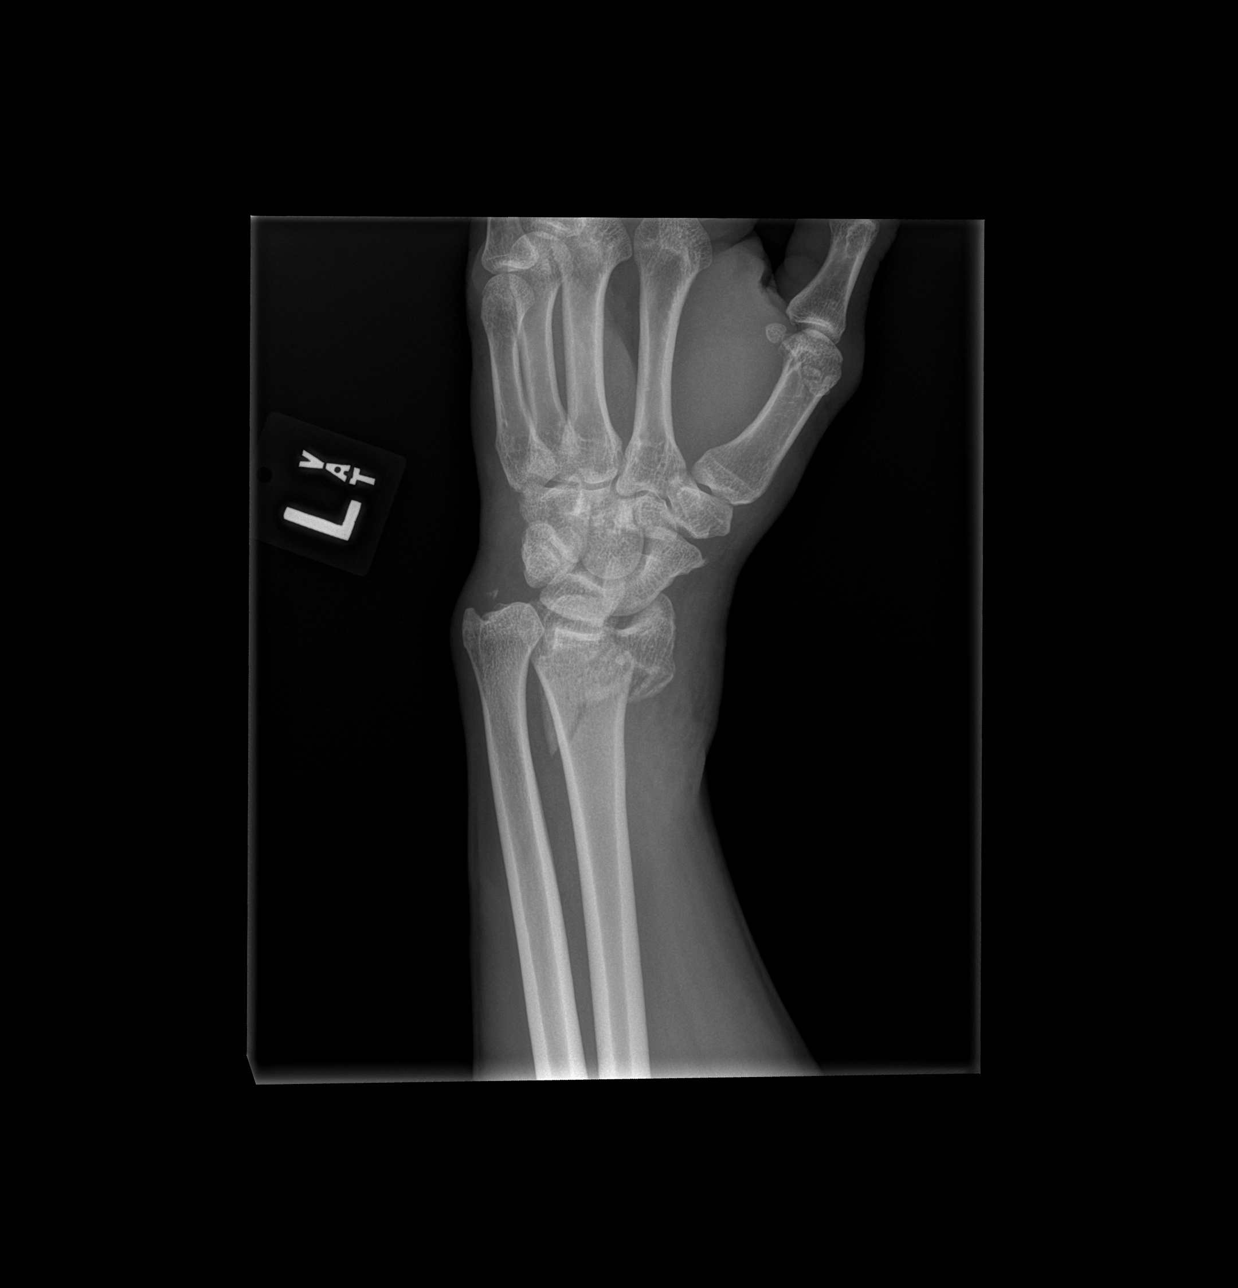

[x wrist lat left]
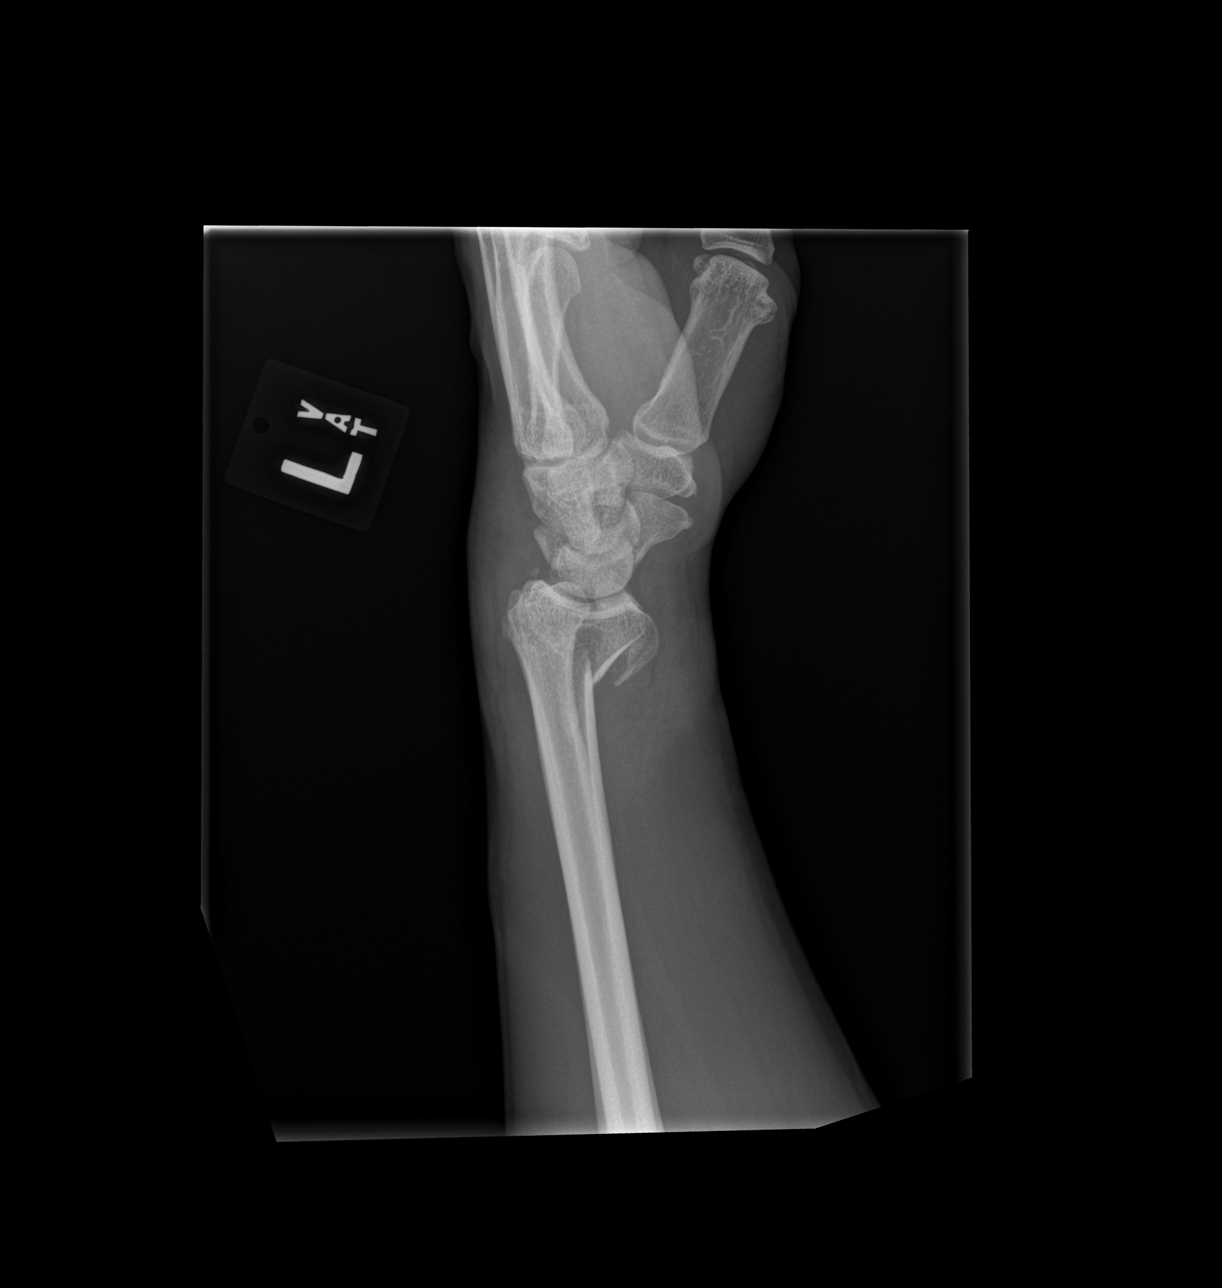

[3 of 3 positions shown; findings below may reference images not displayed]

FINDINGS: Comminuted, intra-articular fracture of the distal radius. There are
transverse and oblique fracture lines. Fractures intersect the
articular surface between the lunate and scaphoid facets of the
distal radius. Full low fracture components are displaced anteriorly
by approximately 6 mm, carpus displacing with the volar fragment
component.

Small associated ulnar styloid fracture.

Joints are normally spaced and aligned.

There is surrounding soft tissue swelling.
IMPRESSION: 1. Sine fracture of the distal radius, with intra-articular
extension and with mild volar displacement of approximately 6 mm.
Small associated ulnar styloid fracture. No dislocation.

## 2023-08-07 ENCOUNTER — Encounter (HOSPITAL_COMMUNITY): Payer: Self-pay | Admitting: Emergency Medicine

## 2023-08-07 ENCOUNTER — Other Ambulatory Visit: Payer: Self-pay

## 2023-08-07 ENCOUNTER — Emergency Department (HOSPITAL_COMMUNITY)
Admission: EM | Admit: 2023-08-07 | Discharge: 2023-08-07 | Disposition: A | Discharge status: Home / Self Care | Attending: Emergency Medicine | Admitting: Emergency Medicine

## 2023-08-07 ENCOUNTER — Emergency Department (HOSPITAL_COMMUNITY)

## 2023-08-07 DIAGNOSIS — S0990XA Unspecified injury of head, initial encounter: Secondary | ICD-10-CM | POA: Diagnosis present

## 2023-08-07 DIAGNOSIS — Z23 Encounter for immunization: Secondary | ICD-10-CM | POA: Diagnosis not present

## 2023-08-07 DIAGNOSIS — Y908 Blood alcohol level of 240 mg/100 ml or more: Secondary | ICD-10-CM | POA: Insufficient documentation

## 2023-08-07 DIAGNOSIS — S0101XA Laceration without foreign body of scalp, initial encounter: Secondary | ICD-10-CM | POA: Diagnosis not present

## 2023-08-07 DIAGNOSIS — W01198A Fall on same level from slipping, tripping and stumbling with subsequent striking against other object, initial encounter: Secondary | ICD-10-CM | POA: Insufficient documentation

## 2023-08-07 DIAGNOSIS — F10920 Alcohol use, unspecified with intoxication, uncomplicated: Secondary | ICD-10-CM

## 2023-08-07 LAB — CBC WITH DIFFERENTIAL/PLATELET
Abs Immature Granulocytes: 0.01 10*3/uL (ref 0.00–0.07)
Basophils Absolute: 0 10*3/uL (ref 0.0–0.1)
Basophils Relative: 0 %
Eosinophils Absolute: 0 10*3/uL (ref 0.0–0.5)
Eosinophils Relative: 1 %
HCT: 42.2 % (ref 39.0–52.0)
Hemoglobin: 13.8 g/dL (ref 13.0–17.0)
Immature Granulocytes: 0 %
Lymphocytes Relative: 18 %
Lymphs Abs: 1.1 10*3/uL (ref 0.7–4.0)
MCH: 31.3 pg (ref 26.0–34.0)
MCHC: 32.7 g/dL (ref 30.0–36.0)
MCV: 95.7 fL (ref 80.0–100.0)
Monocytes Absolute: 0.7 10*3/uL (ref 0.1–1.0)
Monocytes Relative: 11 %
Neutro Abs: 4.1 10*3/uL (ref 1.7–7.7)
Neutrophils Relative %: 70 %
Platelets: 231 10*3/uL (ref 150–400)
RBC: 4.41 MIL/uL (ref 4.22–5.81)
RDW: 12.5 % (ref 11.5–15.5)
WBC: 5.9 10*3/uL (ref 4.0–10.5)
nRBC: 0 % (ref 0.0–0.2)

## 2023-08-07 LAB — COMPREHENSIVE METABOLIC PANEL
ALT: 25 U/L (ref 0–44)
AST: 26 U/L (ref 15–41)
Albumin: 4.2 g/dL (ref 3.5–5.0)
Alkaline Phosphatase: 51 U/L (ref 38–126)
Anion gap: 13 (ref 5–15)
BUN: 14 mg/dL (ref 6–20)
CO2: 20 mmol/L — ABNORMAL LOW (ref 22–32)
Calcium: 8.8 mg/dL — ABNORMAL LOW (ref 8.9–10.3)
Chloride: 108 mmol/L (ref 98–111)
Creatinine, Ser: 0.66 mg/dL (ref 0.61–1.24)
GFR, Estimated: >60 mL/min (ref >60)
Glucose, Bld: 119 mg/dL — ABNORMAL HIGH (ref 70–99)
Potassium: 3.2 mmol/L — ABNORMAL LOW (ref 3.5–5.1)
Sodium: 141 mmol/L (ref 135–145)
Total Bilirubin: 0.5 mg/dL (ref 0.0–1.2)
Total Protein: 7.9 g/dL (ref 6.5–8.1)

## 2023-08-07 LAB — ETHANOL: Alcohol, Ethyl (B): 384 mg/dL (ref <10)

## 2023-08-07 MED ORDER — TETANUS-DIPHTH-ACELL PERTUSSIS 5-2.5-18.5 LF-MCG/0.5 IM SUSY
0.5000 mL | PREFILLED_SYRINGE | Freq: Once | INTRAMUSCULAR | Status: AC
  Administered 2023-08-07: 0.5 mL via INTRAMUSCULAR
  Filled 2023-08-07: qty 0.5

## 2023-08-07 NOTE — Discharge Instructions (Signed)
Take 4 over the counter ibuprofen tablets 3 times a day or 2 over-the-counter naproxen tablets twice a day for pain. Also take tylenol '1000mg'$ (2 extra strength) four times a day.    Follow up with your doctor in the office.

## 2023-08-07 NOTE — ED Provider Notes (Signed)
 [place] [facility] AT [facility] Provider Note   CSN: [id number] Arrival date & time: 08/07/23  1241     History {Add pertinent medical, surgical, social history, OB history to HPI:1} Chief Complaint  Patient presents with   Fall   Head Laceration    Roberto Rogers is a 58 y.o. male.  58 year old male with prior medical history as detailed below presents for evaluation.  Patient drank excessive alcohol this morning.  He reports that he lost his balance and fell backwards.  He did strike his head.  He has a 2 cm laceration to the posterior scalp.  He denies LOC.  He denies other injury.  He is clinically intoxicated during evaluation.  The history is provided by the patient and medical records.       Home Medications Prior to Admission medications   Medication Sig Start Date End Date Taking? Authorizing Provider  ondansetron (ZOFRAN ODT) 8 MG disintegrating tablet Take 1 tablet (8 mg total) by mouth every 8 (eight) hours as needed for nausea or vomiting. Patient not taking: Reported on 06/01/2020 02/20/20   Zadie Rhine, MD  diphenhydrAMINE (BENADRYL) 25 MG tablet Take 1 tablet (25 mg total) by mouth every 6 (six) hours as needed. 11/12/19 02/20/20  Margarita Grizzle, MD      Allergies    Patient has no known allergies.    Review of Systems   Review of Systems  All other systems reviewed and are negative.   Physical Exam Updated Vital Signs Ht 5\' 6"  (1.676 m)   Wt 72.6 kg   BMI 25.82 kg/m  Physical Exam Vitals and nursing note reviewed.  Constitutional:      General: He is not in acute distress.    Appearance: Normal appearance. He is well-developed.  HENT:     Head: Normocephalic.     Comments: 2 cm linear laceration to the posterior scalp.  No active bleeding. Eyes:     Conjunctiva/sclera: Conjunctivae normal.     Pupils: Pupils are equal, round, and reactive to light.  Cardiovascular:     Rate and Rhythm: Normal rate and regular rhythm.      Heart sounds: Normal heart sounds.  Pulmonary:     Effort: Pulmonary effort is normal. No respiratory distress.     Breath sounds: Normal breath sounds.  Abdominal:     General: There is no distension.     Palpations: Abdomen is soft.     Tenderness: There is no abdominal tenderness.  Musculoskeletal:        General: No deformity. Normal range of motion.     Cervical back: Normal range of motion and neck supple.  Skin:    General: Skin is warm and dry.  Neurological:     General: No focal deficit present.     Mental Status: He is alert and oriented to person, place, and time.     ED Results / Procedures / Treatments   Labs (all labs ordered are listed, but only abnormal results are displayed) Labs Reviewed  ETHANOL  COMPREHENSIVE METABOLIC PANEL  CBC WITH DIFFERENTIAL/PLATELET    EKG None  Radiology No results found.  Procedures .Laceration Repair  Date/Time: 08/07/2023 1:14 PM  Performed by: Wynetta Fines, MD Authorized by: Wynetta Fines, MD   Consent:    Consent obtained:  Verbal   Consent given by:  Patient   Risks, benefits, and alternatives were discussed: yes     Risks discussed:  Infection, need for  additional repair, nerve damage, poor wound healing, poor cosmetic result, pain, retained foreign body, tendon damage and vascular damage   Alternatives discussed:  No treatment Universal protocol:    Immediately prior to procedure, a time out was called: yes     Patient identity confirmed:  Verbally with patient Anesthesia:    Anesthesia method:  None Laceration details:    Location:  Scalp   Length (cm):  2 Pre-procedure details:    Preparation:  Imaging obtained to evaluate for foreign bodies Exploration:    Limited defect created (wound extended): no     Hemostasis achieved with:  Direct pressure   Imaging outcome: foreign body not noted     Contaminated: no   Treatment:    Area cleansed with:  Saline   Amount of cleaning:  Standard    Irrigation solution:  Sterile saline   Irrigation method:  Syringe   Visualized foreign bodies/material removed: no     Debridement:  None   Undermining:  None Skin repair:    Repair method:  Staples   Number of staples:  6 Approximation:    Approximation:  Close Repair type:    Repair type:  Simple Post-procedure details:    Dressing:  Bulky dressing   Procedure completion:  Tolerated   {Document cardiac monitor, telemetry assessment procedure when appropriate:1}  Medications Ordered in ED Medications  Tdap (BOOSTRIX) injection 0.5 mL (has no administration in time range)    ED Course/ Medical Decision Making/ A&P   {   Click here for ABCD2, HEART and other calculatorsREFRESH Note before signing :1}                              Medical Decision Making Amount and/or Complexity of Data Reviewed Labs: ordered. Radiology: ordered.  Risk Prescription drug management.    Medical Screen Complete  This patient presented to the ED with complaint of fall, head injury, alcohol intoxication.  This complaint involves an extensive number of treatment options. The initial differential diagnosis includes, but is not limited to, intoxication, traumatic head injury, scalp laceration, metabolic abnormality, etc.  This presentation is: Acute, Self-Limited, Previously Undiagnosed, Uncertain Prognosis, Complicated, Systemic Symptoms, and Threat to Life/Bodily Function  Patient is presenting from home after fall with head injury.  He is clinically intoxicated.  He suffered a scalp laceration.  Scalp laceration repaired without difficulty.  Screening labs are remarkable for elevated alcohol.  Patient's CT imaging is without evidence of acute traumatic injury.  Patient will require reevaluation prior to likely discharge.  Patient should be appropriate for discharge after his alcohol metabolizes.   Co morbidities that complicated the patient's evaluation  See HPI   Additional  history obtained:   External records from outside sources obtained and reviewed including prior ED visits and prior Inpatient records.    Lab Tests:  I ordered and personally interpreted labs.  The pertinent results include:  CBC CMP ETOH   Imaging Studies ordered:  I ordered imaging studies including CT Head /  CT Cspine I independently visualized and interpreted obtained imaging which showed NAD I agree with the radiologist interpretation.   Cardiac Monitoring:  The patient was maintained on a cardiac monitor.  I personally viewed and interpreted the cardiac monitor which showed an underlying rhythm of: NSR   Medicines ordered:  I ordered medication including TDAP  for tetanus prophylaxis  Reevaluation of the patient after these medicines showed that the  patient: improved   Problem List / ED Course:  Head Injury, Scalp laceration   Reevaluation:  After the interventions noted above, I reevaluated the patient and found that they have: improved   Disposition:  After consideration of the diagnostic results and the patients response to treatment, I feel that the patent would benefit from completion of ED evaluation.    {Document critical care time when appropriate:1} {Document review of labs and clinical decision tools ie heart score, Chads2Vasc2 etc:1}  {Document your independent review of radiology images, and any outside records:1} {Document your discussion with family members, caretakers, and with consultants:1} {Document social determinants of health affecting pt's care:1} {Document your decision making why or why not admission, treatments were needed:1} Final Clinical Impression(s) / ED Diagnoses Final diagnoses:  Injury of head, initial encounter  Laceration of scalp, initial encounter  Alcoholic intoxication without complication (HCC)    Rx / DC Orders ED Discharge Orders     None

## 2023-08-07 NOTE — ED Provider Notes (Signed)
 Received patient in turnover from Dr. Rodena Medin.  Please see their note for further details of Hx, PE.  Briefly patient is a 58 y.o. male with a Fall and Head Laceration .  Patient found to be intoxicated.  Workup otherwise unremarkable.  Awaiting clinical sobriety.  Patient is now awake and alert send was able to take his IV out on his own and is speaking in complete sentences and ambulating.  I feel he is clinically sober for discharge.    Melene Plan, DO 08/07/23 1540

## 2023-08-07 NOTE — ED Triage Notes (Signed)
 Pt bib EMS from home. Pt fell unwitnessed backwards and hit back of head. Pt has been drinking beer and alcohol per pts wife. Pt denies pain. Pt not on thinners.

## 2023-08-14 ENCOUNTER — Emergency Department (HOSPITAL_COMMUNITY)
Admission: EM | Admit: 2023-08-14 | Discharge: 2023-08-14 | Disposition: A | Discharge status: Home / Self Care | Attending: Emergency Medicine | Admitting: Emergency Medicine

## 2023-08-14 DIAGNOSIS — Z4802 Encounter for removal of sutures: Secondary | ICD-10-CM | POA: Insufficient documentation

## 2023-08-14 NOTE — ED Provider Notes (Signed)
 [place] EMERGENCY DEPARTMENT AT [facility] Provider Note   CSN: [id number] Arrival date & time: 08/14/23  4098     History  Chief Complaint  Patient presents with   Suture / Staple Removal    Roberto Rogers is a 58 y.o. male.  57 year old male presents today for concern of staple removal.  These were placed on 3/8.  See previous note for full details.  He denies any signs of infection that he has noted over the past week.  Denies any other complaints today.  The history is provided by the patient. No language interpreter was used.       Home Medications Prior to Admission medications   Medication Sig Start Date End Date Taking? Authorizing Provider  ondansetron (ZOFRAN ODT) 8 MG disintegrating tablet Take 1 tablet (8 mg total) by mouth every 8 (eight) hours as needed for nausea or vomiting. Patient not taking: Reported on 06/01/2020 02/20/20   Zadie Rhine, MD  diphenhydrAMINE (BENADRYL) 25 MG tablet Take 1 tablet (25 mg total) by mouth every 6 (six) hours as needed. 11/12/19 02/20/20  Margarita Grizzle, MD      Allergies    Patient has no known allergies.    Review of Systems   Review of Systems  Constitutional:  Negative for chills and fever.  Skin:  Positive for wound.  All other systems reviewed and are negative.   Physical Exam Updated Vital Signs BP (!) 142/84 (BP Location: Right Arm)   Pulse 74   Temp 97.7 F (36.5 C) (Oral)   Resp 16   Ht 5\' 6"  (1.676 m)   Wt 72.6 kg   SpO2 98%   BMI 25.82 kg/m  Physical Exam Vitals and nursing note reviewed.  Constitutional:      General: He is not in acute distress.    Appearance: Normal appearance. He is not ill-appearing.  HENT:     Head: Normocephalic and atraumatic.     Comments: Sick staples noted over the scalp.  Wound is well-healed.  No signs of surrounding erythema, or drainage.    Nose: Nose normal.  Eyes:     Conjunctiva/sclera: Conjunctivae normal.  Pulmonary:     Effort: Pulmonary effort  is normal. No respiratory distress.  Musculoskeletal:        General: No deformity. Normal range of motion.     Cervical back: Normal range of motion.  Skin:    Findings: No rash.  Neurological:     Mental Status: He is alert.     ED Results / Procedures / Treatments   Labs (all labs ordered are listed, but only abnormal results are displayed) Labs Reviewed - No data to display  EKG None  Radiology No results found.  Procedures Suture Removal  Date/Time: 08/14/2023 9:44 AM  Performed by: Marita Kansas, PA-C Authorized by: Marita Kansas, PA-C   Consent:    Consent obtained:  Verbal   Consent given by:  Patient   Risks, benefits, and alternatives were discussed: yes     Risks discussed:  Bleeding, pain and wound separation Universal protocol:    Procedure explained and questions answered to patient or proxy's satisfaction: yes     Relevant documents present and verified: yes     Patient identity confirmed:  Verbally with patient Location:    Location:  Head/neck   Head/neck location:  Scalp Procedure details:    Wound appearance:  No signs of infection, good wound healing and clean   Number of staples  removed:  6 Post-procedure details:    Procedure completion:  Tolerated well, no immediate complications     Medications Ordered in ED Medications - No data to display  ED Course/ Medical Decision Making/ A&P                                 Medical Decision Making  58 year old male presents today for concern of staple removal.  These were placed on 3/8.  See previous note for full details.  Wound is well-appearing without signs of infection. 6 Staples were removed.  Discharged in stable condition.   Final Clinical Impression(s) / ED Diagnoses Final diagnoses:  Encounter for staple removal    Rx / DC Orders ED Discharge Orders     None         Marita Kansas, PA-C 08/14/23 0945    Derwood Kaplan, MD 08/14/23 1055

## 2023-08-14 NOTE — ED Triage Notes (Signed)
 Patient in ED to have staples removed from head Placed 7 days ago Denies pain in triage

## 2023-08-14 NOTE — Discharge Instructions (Signed)
 Sick staples were removed today.  No signs of infection over the wound.  Return for any emergent symptoms.

## 2024-04-30 ENCOUNTER — Encounter (HOSPITAL_COMMUNITY): Payer: Self-pay

## 2024-04-30 ENCOUNTER — Other Ambulatory Visit: Payer: Self-pay

## 2024-04-30 ENCOUNTER — Emergency Department (HOSPITAL_COMMUNITY): Admission: EM | Admit: 2024-04-30 | Discharge: 2024-04-30 | Disposition: A | Discharge status: Home / Self Care

## 2024-04-30 DIAGNOSIS — Y92410 Unspecified street and highway as the place of occurrence of the external cause: Secondary | ICD-10-CM | POA: Insufficient documentation

## 2024-04-30 DIAGNOSIS — M549 Dorsalgia, unspecified: Secondary | ICD-10-CM | POA: Diagnosis not present

## 2024-04-30 DIAGNOSIS — S0502XA Injury of conjunctiva and corneal abrasion without foreign body, left eye, initial encounter: Secondary | ICD-10-CM | POA: Insufficient documentation

## 2024-04-30 DIAGNOSIS — R519 Headache, unspecified: Secondary | ICD-10-CM | POA: Insufficient documentation

## 2024-04-30 MED ORDER — KETOROLAC TROMETHAMINE 15 MG/ML IJ SOLN
15.0000 mg | Freq: Once | INTRAMUSCULAR | Status: AC
  Administered 2024-04-30: 15 mg via INTRAMUSCULAR
  Filled 2024-04-30: qty 1

## 2024-04-30 MED ORDER — TETRACAINE HCL 0.5 % OP SOLN
1.0000 [drp] | Freq: Once | OPHTHALMIC | Status: AC
  Administered 2024-04-30: 1 [drp] via OPHTHALMIC
  Filled 2024-04-30: qty 4

## 2024-04-30 MED ORDER — FLUORESCEIN SODIUM 1 MG OP STRP
1.0000 | ORAL_STRIP | Freq: Once | OPHTHALMIC | Status: AC
  Administered 2024-04-30: 1 via OPHTHALMIC
  Filled 2024-04-30: qty 1

## 2024-04-30 MED ORDER — ERYTHROMYCIN 5 MG/GM OP OINT
1.0000 | TOPICAL_OINTMENT | Freq: Once | OPHTHALMIC | Status: AC
  Administered 2024-04-30: 1 via OPHTHALMIC
  Filled 2024-04-30: qty 3.5

## 2024-04-30 MED ORDER — METHOCARBAMOL 500 MG PO TABS: 1000.0000 mg | ORAL_TABLET | Freq: Four times a day (QID) | ORAL | 0 refills | Status: AC | PRN

## 2024-04-30 NOTE — Discharge Instructions (Addendum)
 Use the erythromycin ointment 3 times a day.  You can also pick up and use artificial tears as needed for eye discomfort.  You can alternate Tylenol  and Motrin as needed for pain and take your Robaxin as needed for pain and spasms up to 4 times a day.
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
